# Patient Record
Sex: Male | Born: 1965
Health system: Southern US, Community
[De-identification: ages and names within clinical notes are randomized; demographics above are authoritative.]

## PROBLEM LIST (undated history)

## (undated) ENCOUNTER — Emergency Department (HOSPITAL_COMMUNITY): Admission: EM | Payer: Self-pay | Source: Home / Self Care

## (undated) DIAGNOSIS — E039 Hypothyroidism, unspecified: Secondary | ICD-10-CM

## (undated) DIAGNOSIS — F419 Anxiety disorder, unspecified: Secondary | ICD-10-CM

## (undated) DIAGNOSIS — K589 Irritable bowel syndrome without diarrhea: Secondary | ICD-10-CM

## (undated) HISTORY — PX: COLOSTOMY: SHX63

## (undated) HISTORY — DX: Irritable bowel syndrome, unspecified: K58.9

## (undated) HISTORY — DX: Hypothyroidism, unspecified: E03.9

---

## 1998-12-13 HISTORY — PX: VASECTOMY: SHX75

## 2003-02-12 ENCOUNTER — Encounter: Payer: Self-pay | Admitting: Family Medicine

## 2003-02-12 ENCOUNTER — Ambulatory Visit (HOSPITAL_COMMUNITY): Admission: RE | Admit: 2003-02-12 | Discharge: 2003-02-12 | Payer: Self-pay | Admitting: Family Medicine

## 2005-01-05 ENCOUNTER — Ambulatory Visit (HOSPITAL_COMMUNITY): Admission: RE | Admit: 2005-01-05 | Discharge: 2005-01-05 | Payer: Self-pay | Admitting: Family Medicine

## 2005-02-15 ENCOUNTER — Ambulatory Visit: Payer: Self-pay | Admitting: Internal Medicine

## 2005-02-19 ENCOUNTER — Ambulatory Visit (HOSPITAL_COMMUNITY): Admission: RE | Admit: 2005-02-19 | Discharge: 2005-02-19 | Payer: Self-pay | Admitting: Internal Medicine

## 2005-02-22 ENCOUNTER — Ambulatory Visit (HOSPITAL_COMMUNITY): Admission: RE | Admit: 2005-02-22 | Discharge: 2005-02-22 | Payer: Self-pay | Admitting: Internal Medicine

## 2005-03-15 ENCOUNTER — Ambulatory Visit: Payer: Self-pay | Admitting: Internal Medicine

## 2005-03-18 ENCOUNTER — Emergency Department (HOSPITAL_COMMUNITY): Admission: EM | Admit: 2005-03-18 | Discharge: 2005-03-19 | Payer: Self-pay | Admitting: *Deleted

## 2005-12-13 HISTORY — PX: CHOLECYSTECTOMY: SHX55

## 2006-01-07 ENCOUNTER — Encounter (HOSPITAL_COMMUNITY): Admission: RE | Admit: 2006-01-07 | Discharge: 2006-02-06 | Payer: Self-pay | Admitting: Family Medicine

## 2006-02-15 ENCOUNTER — Ambulatory Visit (HOSPITAL_COMMUNITY): Admission: RE | Admit: 2006-02-15 | Discharge: 2006-02-16 | Payer: Self-pay | Admitting: *Deleted

## 2006-02-15 ENCOUNTER — Encounter (INDEPENDENT_AMBULATORY_CARE_PROVIDER_SITE_OTHER): Payer: Self-pay | Admitting: Specialist

## 2006-02-16 ENCOUNTER — Observation Stay (HOSPITAL_COMMUNITY): Admission: EM | Admit: 2006-02-16 | Discharge: 2006-02-17 | Payer: Self-pay | Admitting: Emergency Medicine

## 2009-12-15 ENCOUNTER — Ambulatory Visit: Payer: Self-pay | Admitting: Orthopedic Surgery

## 2009-12-15 DIAGNOSIS — L03019 Cellulitis of unspecified finger: Secondary | ICD-10-CM

## 2009-12-15 DIAGNOSIS — M76829 Posterior tibial tendinitis, unspecified leg: Secondary | ICD-10-CM | POA: Insufficient documentation

## 2011-01-03 ENCOUNTER — Encounter (INDEPENDENT_AMBULATORY_CARE_PROVIDER_SITE_OTHER): Payer: Self-pay | Admitting: Internal Medicine

## 2011-01-12 NOTE — Letter (Signed)
Summary: History form  History form   Imported By: Jacklynn Ganong 12/16/2009 15:33:01  _____________________________________________________________________  External Attachment:    Type:   Image     Comment:   External Document

## 2011-01-12 NOTE — Assessment & Plan Note (Signed)
Summary: NAIL REMOVAL/PER DR HARRISON/CAF   Vital Signs:  Patient profile:   45 year old male Weight:      173 pounds Pulse rate:   76 / minute Resp:     18 per minute  Vitals Entered By: Fuller Canada MD (December 15, 2009 4:37 PM)  Visit Type:  Initial visit  Referring Provider:  self referral   CC:  I have a splinter in my fingernail .  History of Present Illness: I saw John Wu in the office today for an initial visit.  He is a 45 years old man with the complaint of:  chief complaint:  splinter under finger nail.  pain -duration 12/05/09.  -location left index finger splinter under finger nail.  -severity [1-10] sore, no pain. -timing constant  -worsened ZO:XWRUEAV it  -improved by: OTC meds  -quality: aching  -context: He was getting the Christmas gifts out of the attic and a splinter entered his finger. He pulled some of it out. Over the last 2 days he noticed increased redness, swelling and discomfort.  -other symptoms no numbness. -xrays done & where:  none needed.   Allergies (verified): No Known Drug Allergies  Past History:  Past Medical History: thyroid  Past Surgical History: gallbladder vasectomy  Family History: Family History Coronary Heart Disease male < 92  Social History: Patient is married.  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  Review of Systems MS:  Complains of joint pain; denies rheumatoid arthritis, joint swelling, gout, bone cancer, osteoporosis, and .  The review of systems is negative for General, Cardiac , Resp, GI, GU, Neuro, Endo, Psych, Derm, EENT, Immunology, and Lymphatic.  Physical Exam  General:  Well developed, well nourished, normal body habitus; no deformities, normal grooming. Head:  normocephalic and atraumatic Neck:  no masses, thyromegaly, or abnormal cervical nodes Msk:   normal posture and gait Pulses:  pulses in the left hand and right foot are normal  Extremities:  left  index finger  there is redness and erythema of the nail fold with swelling and tenderness; there appears to be a splinter under the nail.  ROM of the IP joints is normal   motor function is normal   all of the IP joints are stable    right ankle: swelling and tenderness are noted over the medial ankle beneath the medial malleolus. the posterior tibial tendon is tender up to the lower 1/3 of the tibia.  there is pain with passive eversion of the foot   the arch is low but not on the floor. the pes palnus is flexible.   5-/5 weakness in the PTT.   the ankle is stable  Neurologic:  no focal deficits, normal coordination, sensation hand and foot  Skin:  see previous sections, normal except the left IF   no lymphangitis  Psych:  alert and cooperative; normal mood and affect; normal attention span and concentration   Impression & Recommendations:  Problem # 1:  TENDINITIS, TIBIALIS (ICD-726.72) Assessment New  3 VIEWS ANKLE   HEEL SPUR AND ACHILLES SPUR, MILD ANATOMIC CHANGES TO THE TALUS [NOT OA]  IMPERESSION: NORMAL ANKLE WITH INCIDENTAL SPURS   NSAID, ORTHOTIC, STRETCHING, OBTAIN NEW SHOES FOR TRAINING [CURRENT ONES ARE 6 MOS OLD], ICE X 6 WEEKS   Orders: Ankle x-ray complete,  minimum 3 views (40981) New Patient Level IV (19147)  Problem # 2:  PARONYCHIA FINGER (ICD-681.02) Assessment: New  PROCEDURE   NAIL REMOVAL LIF:  ANESTHESIA LOCAL 1% LIDOCAINE PLAIN 20 CC  DX: PARONYCHIA FB REMOVAL   AFTER ADEQUATE ANESTHESIA, THE NAILPLATE WAS SEPARATED FROM THE NAIL BED WITH BLUNT DISSECTION. IT WAS REMOVED AND THE FB WAS ENCOUNTERED. IT WAS REMOVED. THE PARONYCHIA WAS THEN DECOMPRESSED.  STERILE DRESS AND FINR DRESSING WERE APPLIED   800MG  IBUPROFEN WAS GIVEN BY MOUTH   Orders: New Patient Level IV (96295)  Medications Added to Medication List This Visit: 1)  Keflex 500 Mg Caps (Cephalexin) .Marland Kitchen.. 1 by mouth two times a day 2)  Vicodin 5-500 Mg Tabs  (Hydrocodone-acetaminophen) .Marland Kitchen.. 1 by mouth q 4 as needed pain  Patient Instructions: 1)  i will see him at his home on weds  Prescriptions: VICODIN 5-500 MG TABS (HYDROCODONE-ACETAMINOPHEN) 1 by mouth q 4 as needed pain  #30 x 1   Entered and Authorized by:   Fuller Canada MD   Signed by:   Fuller Canada MD on 12/15/2009   Method used:   Print then Give to Patient   RxID:   [id number] KEFLEX 500 MG CAPS (CEPHALEXIN) 1 by mouth two times a day  #20 x 0   Entered and Authorized by:   Fuller Canada MD   Signed by:   Fuller Canada MD on 12/15/2009   Method used:   Print then Give to Patient   RxID:   [id number]

## 2011-04-30 NOTE — Op Note (Signed)
NAMEBLAS, RICHES              ACCOUNT NO.:  [id number]   MEDICAL RECORD NO.:  [id number]          PATIENT TYPE:  AMB   LOCATION:  DAY                          FACILITY:  [facility]   PHYSICIAN:  Alfonse Ras, MD   DATE OF BIRTH:  03/12/1966   DATE OF PROCEDURE:  02/15/2006  DATE OF DISCHARGE:                                 OPERATIVE REPORT   PREOPERATIVE DIAGNOSIS:  Symptomatic biliary dyskinesia.   POSTOPERATIVE DIAGNOSIS:  Symptomatic biliary dyskinesia.   PROCEDURE:  Laparoscopic cholecystectomy with intraoperative cholangiogram.   SURGEON:  Alfonse Ras, MD   ASSISTANT:  Lorne Skeens. Hoxworth, M.D.   FINDINGS:  Normal cholangiogram.   DESCRIPTION:  The patient was taken to the operating room, and placed in the  supine position after adequate general anesthesia was induced using  endotracheal tube, the abdomen was prepped and draped in a normal sterile  fashion. Using a transverse infraumbilical incision, I dissected down to the  fascia. The fascia was opened vertically. An #0 Vicryl pursestring suture  was placed on the fascial defect. A Hasson trocar was placed in the abdomen  and pneumoperitoneum was obtained.   Under direct vision an additional 10-mm trocar was placed in the subxiphoid  region and two 5-mm ports were placed in the right abdomen. Gallbladder was  identified and retracted cephalad. Dissection at the infundibulum of the  gallbladder and getting a critical view of the cystic duct, it was dissected  out and clipped proximally. It was a very small duct, but a Reddick catheter  was introduced into it after a ductotomy was made. Cholangiogram was  performed which showed free flow into the duodenum, normal common bile duct  and normal hepatic ducts. There were no filling defects. The catheter was  removed and then the duct was triply clipped and divided. Cystic artery was  dissected in a similar fashion, triply clipped and divided.   Gallbladder was  taken off the gallbladder bed using Bovie electrocautery  placed in EndoCatch bag, and removed through the umbilical port. Adequate  hemostasis was assured. Pneumoperitoneum was released. Incisions were  injected with Marcaine. The infraumbilical fascial defect was closed with an  #0 Vicryl pursestring suture. Skin incisions were closed with subcuticular 4-  0 Monocryls. Steri-Strips and sterile dressings were applied. The patient  tolerated the procedure well, and went to PACU in good condition.      Alfonse Ras, MD  Electronically Signed    KRE/MEDQ  D:  02/15/2006  T:  02/16/2006  Job:  [id number]

## 2012-01-06 ENCOUNTER — Other Ambulatory Visit: Payer: Self-pay | Admitting: Family Medicine

## 2012-01-11 ENCOUNTER — Ambulatory Visit (HOSPITAL_COMMUNITY)
Admission: RE | Admit: 2012-01-11 | Discharge: 2012-01-11 | Disposition: A | Discharge status: Home / Self Care | Payer: 59 | Source: Ambulatory Visit | Attending: Family Medicine | Admitting: Family Medicine

## 2012-01-11 DIAGNOSIS — Q619 Cystic kidney disease, unspecified: Secondary | ICD-10-CM | POA: Insufficient documentation

## 2012-01-11 DIAGNOSIS — R109 Unspecified abdominal pain: Secondary | ICD-10-CM | POA: Insufficient documentation

## 2012-05-19 ENCOUNTER — Other Ambulatory Visit: Payer: Self-pay | Admitting: Dermatology

## 2012-06-22 ENCOUNTER — Other Ambulatory Visit: Payer: Self-pay | Admitting: Dermatology

## 2012-10-20 ENCOUNTER — Other Ambulatory Visit: Payer: Self-pay | Admitting: Family Medicine

## 2012-10-20 ENCOUNTER — Ambulatory Visit (HOSPITAL_COMMUNITY)
Admission: RE | Admit: 2012-10-20 | Discharge: 2012-10-20 | Disposition: A | Discharge status: Home / Self Care | Payer: 59 | Source: Ambulatory Visit | Attending: Family Medicine | Admitting: Family Medicine

## 2012-10-20 DIAGNOSIS — R0989 Other specified symptoms and signs involving the circulatory and respiratory systems: Secondary | ICD-10-CM | POA: Insufficient documentation

## 2012-10-20 DIAGNOSIS — R0609 Other forms of dyspnea: Secondary | ICD-10-CM | POA: Insufficient documentation

## 2012-10-20 DIAGNOSIS — R0600 Dyspnea, unspecified: Secondary | ICD-10-CM

## 2012-10-20 DIAGNOSIS — J3489 Other specified disorders of nose and nasal sinuses: Secondary | ICD-10-CM | POA: Insufficient documentation

## 2013-05-23 ENCOUNTER — Encounter: Payer: Self-pay | Admitting: Nurse Practitioner

## 2013-05-23 ENCOUNTER — Ambulatory Visit (INDEPENDENT_AMBULATORY_CARE_PROVIDER_SITE_OTHER): Payer: 59 | Admitting: Nurse Practitioner

## 2013-05-23 ENCOUNTER — Ambulatory Visit: Payer: Self-pay | Admitting: Nurse Practitioner

## 2013-05-23 VITALS — Temp 98.3°F | Wt 180.8 lb

## 2013-05-23 DIAGNOSIS — E039 Hypothyroidism, unspecified: Secondary | ICD-10-CM | POA: Insufficient documentation

## 2013-05-23 DIAGNOSIS — L255 Unspecified contact dermatitis due to plants, except food: Secondary | ICD-10-CM

## 2013-05-23 MED ORDER — METHYLPREDNISOLONE ACETATE 40 MG/ML IJ SUSP
40.0000 mg | Freq: Once | INTRAMUSCULAR | Status: AC
  Administered 2013-05-23: 40 mg via INTRAMUSCULAR

## 2013-05-23 MED ORDER — CLOBETASOL PROPIONATE 0.05 % EX CREA: TOPICAL_CREAM | Freq: Two times a day (BID) | CUTANEOUS | Status: DC

## 2013-05-23 MED ORDER — PREDNISONE 20 MG PO TABS: ORAL_TABLET | ORAL | Status: DC

## 2013-05-23 NOTE — Assessment & Plan Note (Signed)
TSH ordered.

## 2013-05-23 NOTE — Patient Instructions (Signed)
Claritin 10 mg in the morning Benadryl 25 mg at night  

## 2013-05-23 NOTE — Progress Notes (Signed)
Subjective:  Presents complaints of a poison oak rash that began 10 days ago. Began after working outside. Has had a similar rash in the past. Very pruritic. No fever. Mainly on the legs arms and low back area. No wheezing. No fever. Also patient requesting refills on his levothyroxine.  Objective:   Temp(Src) 98.3 F (36.8 C)  Wt 180 lb 12.8 oz (82.01 kg) NAD. Alert, oriented. Lungs clear. Heart regular rate rhythm. Multiple patches of erythema with small clear fluid filled lesions noted particularly on the legs with a few scattered patches on the arms and low back area.  Assessment:Rhus dermatitis - Plan: methylPREDNISolone acetate (DEPO-MEDROL) injection 40 mg  Hypothyroidism - Plan: TSH  Plan: Hold on refills on levothyroxine until TSH results are available. Tomorrow start prednisone 20 mg nine-day taper. Clobetasol cream twice a day when necessary. Antihistamines as directed. Call back if worsens or persists.

## 2013-06-11 ENCOUNTER — Other Ambulatory Visit: Payer: Self-pay | Admitting: Nurse Practitioner

## 2013-06-11 ENCOUNTER — Other Ambulatory Visit: Payer: Self-pay | Admitting: Family Medicine

## 2013-06-11 MED ORDER — LEVOTHYROXINE SODIUM 88 MCG PO TABS: 88.0000 ug | ORAL_TABLET | Freq: Every day | ORAL | Status: DC

## 2013-06-12 ENCOUNTER — Encounter: Payer: Self-pay | Admitting: *Deleted

## 2013-07-09 ENCOUNTER — Other Ambulatory Visit: Payer: Self-pay | Admitting: *Deleted

## 2013-07-09 MED ORDER — LEVOTHYROXINE SODIUM 88 MCG PO TABS: 88.0000 ug | ORAL_TABLET | Freq: Every day | ORAL | Status: DC

## 2013-10-01 ENCOUNTER — Other Ambulatory Visit: Payer: Self-pay | Admitting: *Deleted

## 2013-10-01 MED ORDER — LEVOTHYROXINE SODIUM 88 MCG PO TABS: 88.0000 ug | ORAL_TABLET | Freq: Every day | ORAL | Status: DC

## 2013-10-31 ENCOUNTER — Encounter: Payer: Self-pay | Admitting: Nurse Practitioner

## 2013-10-31 ENCOUNTER — Ambulatory Visit (INDEPENDENT_AMBULATORY_CARE_PROVIDER_SITE_OTHER): Payer: 59 | Admitting: Nurse Practitioner

## 2013-10-31 VITALS — BP 120/72 | Temp 98.6°F | Ht 70.0 in | Wt 184.0 lb

## 2013-10-31 DIAGNOSIS — M62838 Other muscle spasm: Secondary | ICD-10-CM

## 2013-10-31 DIAGNOSIS — J300 Vasomotor rhinitis: Secondary | ICD-10-CM

## 2013-10-31 DIAGNOSIS — G44209 Tension-type headache, unspecified, not intractable: Secondary | ICD-10-CM

## 2013-10-31 DIAGNOSIS — J309 Allergic rhinitis, unspecified: Secondary | ICD-10-CM

## 2013-10-31 MED ORDER — CHLORZOXAZONE 500 MG PO TABS: 500.0000 mg | ORAL_TABLET | Freq: Three times a day (TID) | ORAL | Status: DC | PRN

## 2013-10-31 MED ORDER — FLUTICASONE PROPIONATE 50 MCG/ACT NA SUSP: 2.0000 | Freq: Every day | NASAL | Status: DC

## 2013-10-31 NOTE — Patient Instructions (Addendum)
TENS unit (Smart relief) Ice or heat Massage: [employer]

## 2013-11-01 ENCOUNTER — Encounter: Payer: Self-pay | Admitting: Nurse Practitioner

## 2013-11-01 NOTE — Progress Notes (Signed)
Subjective:  Presents complaints of off-and-on headache for the past week. Mainly occurs in the temporal area and occasionally in the occipital area. No fever cough runny nose sore throat or ear pain. No nausea vomiting. Minimal dizziness. Normal activity. Describes as a nagging headache. No severe pain. No visual changes. No numbness or weakness of the face arms or legs. No difficulty speaking or swallowing. Slight sinus pressure at times. Has used saline rinse and decongestants. Notices headache more when he is stressed. Scalp is slightly tender to palpation at that time.  Objective:   BP 120/72  Temp(Src) 98.6 F (37 C) (Oral)  Ht 5\' 10"  (1.778 m)  Wt 184 lb (83.462 kg)  BMI 26.40 kg/m2 NAD. Alert, oriented. Mildly anxious affect. TMs retracted bilateral, no erythema. Nasal mucosa pale and slightly boggy. Pharynx mildly injected with clear PND noted. Neck supple with mild soft nontender adenopathy. Lungs clear. Heart regular rate rhythm. Minimal tenderness with palpation of the temporal area. Slightly tender very tight muscles noted in the lateral neck area. Also some slight tenderness along the trapezius and into the rhomboids. EOMs intact without nystagmus. Point-to-point localization normal limit. Hand strength 5+ bilateral. Reflexes normal limit. Romberg negative.  Assessment:Vasomotor rhinitis  Muscle contraction headache  Muscle spasms of neck  Plan: Meds ordered this encounter  Medications  . chlorzoxazone (PARAFON) 500 MG tablet    Sig: Take 1 tablet (500 mg total) by mouth 3 (three) times daily as needed for muscle spasms.    Dispense:  30 tablet    Refill:  0    Order Specific Question:  Supervising Provider    Answer:  Merlyn Albert [[id number]]  . fluticasone (FLONASE) 50 MCG/ACT nasal spray    Sig: Place 2 sprays into both nostrils daily. Prn head congestion    Dispense:  16 g    Refill:  5    Order Specific Question:  Supervising Provider    Answer:  Merlyn Albert  [[id number]]  TENS unit (Smart relief) Ice or heat Massage therapy. Discussed importance of stress reduction. Continue OTC antihistamines. Call back if symptoms worsen or persist. Warning signs reviewed.

## 2014-03-07 ENCOUNTER — Other Ambulatory Visit: Payer: Self-pay | Admitting: Family Medicine

## 2014-04-23 ENCOUNTER — Encounter: Payer: Self-pay | Admitting: Orthopedic Surgery

## 2014-04-23 ENCOUNTER — Ambulatory Visit (INDEPENDENT_AMBULATORY_CARE_PROVIDER_SITE_OTHER): Payer: 59

## 2014-04-23 ENCOUNTER — Ambulatory Visit (INDEPENDENT_AMBULATORY_CARE_PROVIDER_SITE_OTHER): Payer: 59 | Admitting: Orthopedic Surgery

## 2014-04-23 VITALS — BP 118/71 | Ht 70.0 in | Wt 180.0 lb

## 2014-04-23 DIAGNOSIS — S5010XA Contusion of unspecified forearm, initial encounter: Secondary | ICD-10-CM

## 2014-04-23 DIAGNOSIS — S5011XA Contusion of right forearm, initial encounter: Secondary | ICD-10-CM | POA: Insufficient documentation

## 2014-04-23 DIAGNOSIS — M25529 Pain in unspecified elbow: Secondary | ICD-10-CM

## 2014-04-23 NOTE — Progress Notes (Signed)
Patient ID: John Wu, male   DOB: 1966/07/17, 48 y.o.   MRN: [id number]  Chief Complaint  Patient presents with  . Arm Pain    Right arm pain due to injury 04/22/14   48 year old male healthy except for some thyroid disease and IBS who was hit with a baseball thrown baseball yesterday complains of dull aching pain over his right forearm without numbness or tingling rates pain 4/10  History of cholecystectomy. History of vasectomy. Takes thyroid medicine daily.  No allergies  Exam normal grooming hygiene orientation and mood. Gait and station are normal. He has tenderness over the mid forearm of the right arm wrist elbow motion normal elbow wrist stable skin intact normal sensation normal pulse normal grip  X-ray negative  Impression  Encounter Diagnoses  Name Primary?  . Pain in joint, upper arm   . Contusion of forearm, right Yes

## 2014-04-23 NOTE — Patient Instructions (Signed)
Ice  Ibuprofen as needed

## 2014-06-05 ENCOUNTER — Other Ambulatory Visit: Payer: Self-pay | Admitting: Family Medicine

## 2014-07-12 ENCOUNTER — Telehealth: Payer: Self-pay | Admitting: Family Medicine

## 2014-07-12 DIAGNOSIS — E039 Hypothyroidism, unspecified: Secondary | ICD-10-CM

## 2014-07-12 NOTE — Telephone Encounter (Signed)
tsh plus ov

## 2014-07-12 NOTE — Telephone Encounter (Signed)
Patients needs order for blood work.

## 2014-07-12 NOTE — Telephone Encounter (Signed)
Blood work -TSH 05/2013

## 2014-07-15 NOTE — Telephone Encounter (Signed)
Blood work order placed in [employer]. Patient notified.

## 2014-07-16 LAB — TSH: TSH: 4.649 u[IU]/mL — ABNORMAL HIGH (ref 0.350–4.500)

## 2014-07-17 ENCOUNTER — Encounter: Payer: Self-pay | Admitting: Family Medicine

## 2014-07-17 ENCOUNTER — Ambulatory Visit (INDEPENDENT_AMBULATORY_CARE_PROVIDER_SITE_OTHER): Payer: 59 | Admitting: Family Medicine

## 2014-07-17 VITALS — BP 114/70 | Ht 70.0 in | Wt 183.0 lb

## 2014-07-17 DIAGNOSIS — Z79899 Other long term (current) drug therapy: Secondary | ICD-10-CM

## 2014-07-17 DIAGNOSIS — Z125 Encounter for screening for malignant neoplasm of prostate: Secondary | ICD-10-CM

## 2014-07-17 DIAGNOSIS — Z0189 Encounter for other specified special examinations: Secondary | ICD-10-CM

## 2014-07-17 DIAGNOSIS — E039 Hypothyroidism, unspecified: Secondary | ICD-10-CM

## 2014-07-17 MED ORDER — LEVOTHYROXINE SODIUM 100 MCG PO TABS: 100.0000 ug | ORAL_TABLET | Freq: Every day | ORAL | Status: DC

## 2014-07-17 NOTE — Progress Notes (Signed)
   Subjective:    Patient ID: John Wu, male    DOB: 27-Sep-1966, 48 y.o.   MRN: [id number]  HPIHypothyroidism check up. Pt states no concerns. Had TSH done on 07/15/14. Pt takes med every day. No problems.   Exercise two mi per d and reg situps, exercising quite regularly  Overall eating welll,  Watching diet  Results for orders placed in visit on 07/12/14  TSH      Result Value Ref Range   TSH 4.649 (*) 0.350 - 4.500 uIU/mL    Some fatigue at times but overall good Review of Systems No headache no chest pain no back pain no abdominal pain no change in bowel habits ROS otherwise negative    Objective:   Physical Exam  Alert no apparent distress. Lungs clear. Heart regular in rhythm. HEENT normal.      Assessment & Plan:  Impression hypothyroidism somewhat suboptimal control. Discussed. Plan increase thyroid to 100 mcg daily. Repeat blood work in 2 or 3 months. Wellness exam in 3 months. WSL

## 2014-11-01 LAB — HEPATIC FUNCTION PANEL
ALK PHOS: 63 U/L (ref 39–117)
ALT: 20 U/L (ref 0–53)
AST: 15 U/L (ref 0–37)
Albumin: 4 g/dL (ref 3.5–5.2)
BILIRUBIN DIRECT: 0.2 mg/dL (ref 0.0–0.3)
BILIRUBIN INDIRECT: 0.5 mg/dL (ref 0.2–1.2)
BILIRUBIN TOTAL: 0.7 mg/dL (ref 0.2–1.2)
Total Protein: 5.8 g/dL — ABNORMAL LOW (ref 6.0–8.3)

## 2014-11-01 LAB — BASIC METABOLIC PANEL
BUN: 19 mg/dL (ref 6–23)
CALCIUM: 9.4 mg/dL (ref 8.4–10.5)
CO2: 30 meq/L (ref 19–32)
CREATININE: 1.07 mg/dL (ref 0.50–1.35)
Chloride: 106 mEq/L (ref 96–112)
GLUCOSE: 97 mg/dL (ref 70–99)
Potassium: 4.7 mEq/L (ref 3.5–5.3)
Sodium: 143 mEq/L (ref 135–145)

## 2014-11-01 LAB — LIPID PANEL
Cholesterol: 146 mg/dL (ref 0–200)
HDL: 37 mg/dL — ABNORMAL LOW (ref >39)
LDL Cholesterol: 88 mg/dL (ref 0–99)
Total CHOL/HDL Ratio: 3.9 Ratio
Triglycerides: 103 mg/dL (ref <150)
VLDL: 21 mg/dL (ref 0–40)

## 2014-11-01 LAB — TSH: TSH: 2.353 u[IU]/mL (ref 0.350–4.500)

## 2014-11-02 LAB — PSA: PSA: 0.85 ng/mL (ref <=4.00)

## 2014-12-10 ENCOUNTER — Ambulatory Visit (INDEPENDENT_AMBULATORY_CARE_PROVIDER_SITE_OTHER): Payer: 59 | Admitting: Family Medicine

## 2014-12-10 ENCOUNTER — Encounter: Payer: Self-pay | Admitting: Family Medicine

## 2014-12-10 VITALS — BP 120/84 | Ht 70.0 in | Wt 184.0 lb

## 2014-12-10 DIAGNOSIS — Z0000 Encounter for general adult medical examination without abnormal findings: Secondary | ICD-10-CM

## 2014-12-10 MED ORDER — LEVOTHYROXINE SODIUM 100 MCG PO TABS: 100.0000 ug | ORAL_TABLET | Freq: Every day | ORAL | Status: DC

## 2014-12-10 NOTE — Progress Notes (Signed)
Subjective:    Patient ID: John Wu, male    DOB: 1966-05-07, 48 y.o.   MRN: [id number]  HPI The patient comes in today for a wellness visit.  A review of their health history was completed.  A review of medications was also completed.  Any needed refills: May need to change the dose of his levothyroxine   Eating habits: Health conscious  Falls/  MVA accidents in past few months: No  Regular exercise: Treadmill daily, runs 2 miles, and sit ups daily (300)  Specialist pt sees on regular basis: No  Preventative health issues were discussed.   Additional concerns: Refuses flu vaccine  Results for orders placed or performed in visit on 07/17/14  TSH  Result Value Ref Range   TSH 2.353 0.350 - 4.500 uIU/mL  PSA  Result Value Ref Range   PSA 0.85 <=4.00 ng/mL  Lipid panel  Result Value Ref Range   Cholesterol 146 0 - 200 mg/dL   Triglycerides 103 <150 mg/dL   HDL 37 (L) >39 mg/dL   Total CHOL/HDL Ratio 3.9 Ratio   VLDL 21 0 - 40 mg/dL   LDL Cholesterol 88 0 - 99 mg/dL  Hepatic function panel  Result Value Ref Range   Total Bilirubin 0.7 0.2 - 1.2 mg/dL   Bilirubin, Direct 0.2 0.0 - 0.3 mg/dL   Indirect Bilirubin 0.5 0.2 - 1.2 mg/dL   Alkaline Phosphatase 63 39 - 117 U/L   AST 15 0 - 37 U/L   ALT 20 0 - 53 U/L   Total Protein 5.8 (L) 6.0 - 8.3 g/dL   Albumin 4.0 3.5 - 5.2 g/dL  Basic metabolic panel  Result Value Ref Range   Sodium 143 135 - 145 mEq/L   Potassium 4.7 3.5 - 5.3 mEq/L   Chloride 106 96 - 112 mEq/L   CO2 30 19 - 32 mEq/L   Glucose, Bld 97 70 - 99 mg/dL   BUN 19 6 - 23 mg/dL   Creat 1.07 0.50 - 1.35 mg/dL   Calcium 9.4 8.4 - 10.5 mg/dL   Good diet overall, but not perfect, eats a lot of salad    Review of Systems  Constitutional: Negative for fever, activity change and appetite change.  HENT: Negative for congestion and rhinorrhea.   Eyes: Negative for discharge.  Respiratory: Negative for cough and wheezing.   Cardiovascular:  Negative for chest pain.  Gastrointestinal: Negative for vomiting, abdominal pain and blood in stool.  Genitourinary: Negative for frequency and difficulty urinating.  Musculoskeletal: Negative for neck pain.  Skin: Negative for rash.  Allergic/Immunologic: Negative for environmental allergies and food allergies.  Neurological: Negative for weakness and headaches.  Psychiatric/Behavioral: Negative for agitation.  All other systems reviewed and are negative.      Objective:   Physical Exam  Constitutional: He appears well-developed and well-nourished.  HENT:  Head: Normocephalic and atraumatic.  Right Ear: External ear normal.  Left Ear: External ear normal.  Nose: Nose normal.  Mouth/Throat: Oropharynx is clear and moist.  Eyes: EOM are normal. Pupils are equal, round, and reactive to light.  Neck: Normal range of motion. Neck supple. No thyromegaly present.  Cardiovascular: Normal rate, regular rhythm and normal heart sounds.   No murmur heard. Pulmonary/Chest: Effort normal and breath sounds normal. No respiratory distress. He has no wheezes.  Abdominal: Soft. Bowel sounds are normal. He exhibits no distension and no mass. There is no tenderness.  Genitourinary: Prostate normal and penis normal.  Musculoskeletal: Normal range of motion. He exhibits no edema.  Lymphadenopathy:    He has no cervical adenopathy.  Neurological: He is alert. He exhibits normal muscle tone.  Skin: Skin is warm and dry. No erythema.  Psychiatric: He has a normal mood and affect. His behavior is normal. Judgment normal.          Assessment & Plan:  Impression wellness exam #2 hypothyroidism good control discussed #3 colonoscopy discussed plan patient to call and set up colonoscopy. Maintain same thyroid. Diet exercise discussed. WSL

## 2015-06-13 ENCOUNTER — Other Ambulatory Visit: Payer: Self-pay | Admitting: Nurse Practitioner

## 2015-06-13 MED ORDER — PREDNISONE 20 MG PO TABS: ORAL_TABLET | ORAL | Status: DC

## 2015-06-13 MED ORDER — MOMETASONE FUROATE 0.1 % EX CREA: 1.0000 "application " | TOPICAL_CREAM | Freq: Every day | CUTANEOUS | Status: DC

## 2015-06-13 NOTE — Progress Notes (Signed)
Fax from his wife: leaving to go out of town today at 4 pm. Has poison oak rash since Monday not responding to topical cortisone and antihistamine. Will send in prednisone taper and elocon cream. Office visit if no better.

## 2015-12-14 ENCOUNTER — Other Ambulatory Visit: Payer: Self-pay | Admitting: Family Medicine

## 2016-03-02 ENCOUNTER — Ambulatory Visit (INDEPENDENT_AMBULATORY_CARE_PROVIDER_SITE_OTHER): Payer: Self-pay | Admitting: Orthopedic Surgery

## 2016-03-02 VITALS — BP 135/70 | HR 70 | Ht 70.0 in | Wt 184.8 lb

## 2016-03-02 DIAGNOSIS — M722 Plantar fascial fibromatosis: Secondary | ICD-10-CM

## 2016-03-02 NOTE — Progress Notes (Signed)
Chief Complaint  Patient presents with  . Foot Pain    Right foot pain   HPI 50 yo with three-month history of right plantar heel pain. Patient has classic restart pain pain after sitting pain when getting up. 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 He has pain getting out of bed in the morning as well. It is best described as sharp burning pain on the plantar aspect of the heel and has been unresponsive to over-the-counter arch support, ice, anti-inflammatory.  Review of Systems  All other systems reviewed and are negative.   Past Medical History  Diagnosis Date  . IBS (irritable bowel syndrome)   . Hypothyroidism     Past Surgical History  Procedure Laterality Date  . Cholecystectomy  2007  . Vasectomy  2000   Family History  Problem Relation Age of Onset  . Hypertension Father   . Heart attack Father    Social History  Substance Use Topics  . Smoking status: Never Smoker   . Smokeless tobacco: Not on file  . Alcohol Use: Not on file    Current outpatient prescriptions:  .  levothyroxine (SYNTHROID, LEVOTHROID) 100 MCG tablet, TAKE 1 TABLET DAILY BEFORE BREAKFAST, Disp: 90 tablet, Rfl: 1 .  mometasone (ELOCON) 0.1 % cream, Apply 1 application topically daily. (Patient not taking: Reported on 03/02/2016), Disp: 45 g, Rfl: 0 .  predniSONE (DELTASONE) 20 MG tablet, 3 po qd x 3 d then 2 po qd x 3 d then 1 po qd x 3 d (Patient not taking: Reported on 03/02/2016), Disp: 18 tablet, Rfl: 0  BP 135/70 mmHg  Pulse 70  Ht 5\' 10"  (1.778 m)  Wt 184 lb 12.8 oz (83.825 kg)  BMI 26.52 kg/m2  Physical Exam  Constitutional: He is oriented to person, place, and time. He appears well-developed and well-nourished. No distress.  Cardiovascular: Normal rate and intact distal pulses.   Neurological: He is alert and oriented to person, place, and time.  Skin: Skin is warm and  dry. No rash noted. He is not diaphoretic. No erythema. No pallor.  Psychiatric: He has a normal mood and affect. His behavior is normal. Judgment and thought content normal.  Ambulatory status is normal Examination of right foot Normal plantigrade foot normal arch with tenderness on the plantar aspect of the heel mild tenderness over the nerve area as well Foot alignment otherwise normal. Ankle stable. Slightly tight heel cord with the knee extended relaxes with the knee flexed plantar flexion strength normal skin.  Ortho Exam   ASSESSMENT: Encounter Diagnosis  Name Primary?  . Plantar fasciitis Yes      PLAN  We will start with the standard treatment with Tuli heel cups, ice therapy, exercise program, cortisone injection.  Procedure note  Plantar fascial injection  Injection of the RIGHT    plantar fashion  Verbal consent was obtained. Timeout confirmed injection site as RIGHT   plantar fascia  25-gauge needle 3 mL syringe  40 mg of Depo-Medrol,  3 mL of 1% lidocaine  The skin was prepped with alcohol and ethyl chloride and the injection was given without complication sterile bandage was applied  The patient was in stable condition

## 2016-03-02 NOTE — Patient Instructions (Addendum)
Plantar Fasciitis With Rehab The plantar fascia is a fibrous, ligament-like, soft-tissue structure that spans the bottom of the foot. Plantar fasciitis, also called heel spur syndrome, is a condition that causes pain in the foot due to inflammation of the tissue. SYMPTOMS   Pain and tenderness on the underneath side of the foot.  Pain that worsens with standing or walking. CAUSES  Plantar fasciitis is caused by irritation and injury to the plantar fascia on the underneath side of the foot. Common mechanisms of injury include:  Direct trauma to bottom of the foot.  Damage to a small nerve that runs under the foot where the main fascia attaches to the heel bone.  Stress placed on the plantar fascia due to bone spurs. RISK INCREASES WITH:   Activities that place stress on the plantar fascia (running, jumping, pivoting, or cutting).  Poor strength and flexibility.  Improperly fitted shoes.  Tight calf muscles.  Flat feet.  Failure to warm-up properly before activity.  Obesity. PREVENTION  Warm up and stretch properly before activity.  Allow for adequate recovery between workouts.  Maintain physical fitness:  Strength, flexibility, and endurance.  Cardiovascular fitness.  Maintain a health body weight.  Avoid stress on the plantar fascia.  Wear properly fitted shoes, including arch supports for individuals who have flat feet. PROGNOSIS  If treated properly, then the symptoms of plantar fasciitis usually resolve without surgery. However, occasionally surgery is necessary. RELATED COMPLICATIONS   Recurrent symptoms that may result in a chronic condition.  Problems of the lower back that are caused by compensating for the injury, such as limping.  Pain or weakness of the foot during push-off following surgery.  Chronic inflammation, scarring, and partial or complete fascia tear, occurring more often from repeated injections. TREATMENT  Treatment initially involves  the use of ice and medication to help reduce pain and inflammation. The use of strengthening and stretching exercises may help reduce pain with activity, especially stretches of the Achilles tendon. These exercises may be performed at home or with a therapist. Your caregiver may recommend that you use heel cups of arch supports to help reduce stress on the plantar fascia. Occasionally, corticosteroid injections are given to reduce inflammation. If symptoms persist for greater than 6 months despite non-surgical (conservative), then surgery may be recommended.  MEDICATION   If pain medication is necessary, then nonsteroidal anti-inflammatory medications, such as aspirin and ibuprofen, or other minor pain relievers, such as acetaminophen, are often recommended.  Do not take pain medication within 7 days before surgery.  Prescription pain relievers may be given if deemed necessary by your caregiver. Use only as directed and only as much as you need.  Corticosteroid injections may be given by your caregiver. These injections should be reserved for the most serious cases, because they may only be given a certain number of times. HEAT AND COLD Cold treatment (icing) relieves pain and reduces inflammation. Use a frozen water bottle and roll the heel over the bottle for 20 min  SEEK IMMEDIATE MEDICAL CARE IF:  Treatment seems to offer no benefit, or the condition worsens.  Any medications produce adverse side effects. EXERCISES RANGE OF MOTION (ROM) AND STRETCHING EXERCISES - Plantar Fasciitis (Heel Spur Syndrome) These exercises may help you when beginning to rehabilitate your injury. Your symptoms may resolve with or without further involvement from your physician, physical therapist or athletic trainer. While completing these exercises, remember:   Restoring tissue flexibility helps normal motion to return to the joints. This  allows healthier, less painful movement and activity.  An effective  stretch should be held for at least 30 seconds.  A stretch should never be painful. You should only feel a gentle lengthening or release in the stretched tissue. RANGE OF MOTION - Toe Extension, Flexion  Sit with your right / left leg crossed over your opposite knee.  Grasp your toes and gently pull them back toward the top of your foot. You should feel a stretch on the bottom of your toes and/or foot.  Hold this stretch for ___2_______ seconds.  Now, gently pull your toes toward the bottom of your foot. You should feel a stretch on the top of your toes and or foot.  Hold this stretch for _____2_____ seconds. Repeat ____15______ times. Complete this stretch ____1______ times per day.  RANGE OF MOTION - Ankle Dorsiflexion, Active Assisted  Remove shoes and sit on a chair that is preferably not on a carpeted surface.  Place right / left foot under knee. Extend your opposite leg for support.  Keeping your heel down, slide your right / left foot back toward the chair until you feel a stretch at your ankle or calf. If you do not feel a stretch, slide your bottom forward to the edge of the chair, while still keeping your heel down.  Hold this stretch for __________ seconds. Repeat __________ times. Complete this stretch __________ times per day.  STRETCH - Gastroc, Standing  Place hands on wall.  Extend right / left leg, keeping the front knee somewhat bent.  Slightly point your toes inward on your back foot.  Keeping your right / left heel on the floor and your knee straight, shift your weight toward the wall, not allowing your back to arch.  You should feel a gentle stretch in the right / left calf. Hold this position for __________ seconds. Repeat __________ times. Complete this stretch __________ times per day. STRETCH - Soleus, Standing  Place hands on wall.  Extend right / left leg, keeping the other knee somewhat bent.  Slightly point your toes inward on your back  foot.  Keep your right / left heel on the floor, bend your back knee, and slightly shift your weight over the back leg so that you feel a gentle stretch deep in your back calf.  Hold this position for __________ seconds. Repeat __________ times. Complete this stretch __________ times per day. STRETCH - Gastrocsoleus, Standing  Note: This exercise can place a lot of stress on your foot and ankle. Please complete this exercise only if specifically instructed by your caregiver.   Place the ball of your right / left foot on a step, keeping your other foot firmly on the same step.  Hold on to the wall or a rail for balance.  Slowly lift your other foot, allowing your body weight to press your heel down over the edge of the step.  You should feel a stretch in your right / left calf.  Hold this position for __________ seconds.  Repeat this exercise with a slight bend in your right / left knee. Repeat __________ times. Complete this stretch __________ times per day.  STRENGTHENING EXERCISES - Plantar Fasciitis (Heel Spur Syndrome)  These exercises may help you when beginning to rehabilitate your injury. They may resolve your symptoms with or without further involvement from your physician, physical therapist or athletic trainer. While completing these exercises, remember:   Muscles can gain both the endurance and the strength needed for everyday activities  through controlled exercises.  Complete these exercises as instructed by your physician, physical therapist or athletic trainer. Progress the resistance and repetitions only as guided. STRENGTH - Towel Curls  Sit in a chair positioned on a non-carpeted surface.  Place your foot on a towel, keeping your heel on the floor.  Pull the towel toward your heel by only curling your toes. Keep your heel on the floor.  If instructed by your physician, physical therapist or athletic trainer, add ____________________ at the end of the towel. Repeat  __________ times. Complete this exercise __________ times per day. STRENGTH - Ankle Inversion  Secure one end of a rubber exercise band/tubing to a fixed object (table, pole). Loop the other end around your foot just before your toes.  Place your fists between your knees. This will focus your strengthening at your ankle.  Slowly, pull your big toe up and in, making sure the band/tubing is positioned to resist the entire motion.  Hold this position for __________ seconds.  Have your muscles resist the band/tubing as it slowly pulls your foot back to the starting position. Repeat __________ times. Complete this exercises __________ times per day.    This information is not intended to replace advice given to you by your health care provider. Make sure you discuss any questions you have with your health care provider.   Document Released: 11/29/2005 Document Revised: 04/15/2015 Document Reviewed: 03/13/2009 Elsevier Interactive Patient Education [employer].

## 2016-04-02 ENCOUNTER — Ambulatory Visit (INDEPENDENT_AMBULATORY_CARE_PROVIDER_SITE_OTHER): Payer: 59 | Admitting: Orthopedic Surgery

## 2016-04-02 ENCOUNTER — Encounter: Payer: Self-pay | Admitting: Orthopedic Surgery

## 2016-04-02 VITALS — BP 108/66 | Ht 70.0 in | Wt 184.0 lb

## 2016-04-02 DIAGNOSIS — M722 Plantar fascial fibromatosis: Secondary | ICD-10-CM | POA: Diagnosis not present

## 2016-04-02 NOTE — Progress Notes (Signed)
Patient ID: John Wu, male   DOB: [birth year], 50 y.o.   MRN: [id number]  Chief Complaint  Patient presents with  . Follow-up    right foot pain    HPI-followed for plantar fasciits; Treatment to date includes injection, heel cups, cryotherapy, stretching exercises and anti-inflammatories  ROS-the patient has no numbness or tingling in the foot. Complains of weightbearing pain when he gets out of bed in the morning  BP 108/66 mmHg  Ht 5\' 10"  (1.778 m)  Wt 184 lb (83.462 kg)  BMI 26.40 kg/m2  Physical Exam  Constitutional: He is oriented to person, place, and time. He appears well-developed and well-nourished. No distress.  Cardiovascular: Normal rate and intact distal pulses.   Pulses:      Dorsalis pedis pulses are 2+ on the right side.       Posterior tibial pulses are 2+ on the right side.  Musculoskeletal:       Right foot: There is tenderness. There is normal range of motion and no deformity.  There is tenderness over the packs to his nerve.  Neurological: He is alert and oriented to person, place, and time.  Skin: Skin is warm and dry. No rash noted. He is not diaphoretic. No erythema. No pallor.  Psychiatric: He has a normal mood and affect. His behavior is normal. Judgment and thought content normal.    Ortho Exam    ASSESSMENT AND PLAN   Plantar fasciitis with Baxter's nerve inflammatory response  Recommend injection. We did a medial approach injection this time including Baxter's nerve and plantar fascia injection  He will continue with a night splint his exercises cryotherapy and heel cups  Follow-up 4 weeks  Injection note  A steroid injection was performed  usmedial aspect of the heel including plantar fascia and Baxter's nerve using 1% lidocaine and 40 mg Depo-Medrol  Tolerated well  Patient gave verbal consent and timeout was completed to execute the injection. No comp occasions were noted

## 2016-05-03 ENCOUNTER — Ambulatory Visit (INDEPENDENT_AMBULATORY_CARE_PROVIDER_SITE_OTHER): Payer: 59 | Admitting: Orthopedic Surgery

## 2016-05-03 DIAGNOSIS — M722 Plantar fascial fibromatosis: Secondary | ICD-10-CM

## 2016-05-03 NOTE — Patient Instructions (Signed)
Continue intermittent use of ibuprofen Bracing at night Home exercises Ice therapy Cam Walker 6 weeks

## 2016-05-03 NOTE — Progress Notes (Signed)
Patient ID: John Wu, male   DOB: 1966/03/27, 50 y.o.   MRN: [id number]  Chief complaint is persistent pain plantar aspect right foot  HPI  The patient has had home exercises, cryotherapy, night splint, anti-inflammatories ibuprofen and Aleve and to injections into the right heel   ROS  No numbness or tingling in the foot at this time  There were no vitals taken for this visit. Gen. appearance is normal grooming and hygiene Orientation to person place and time normal Mood normal Gait is normal today No peripheral edema or swelling is noted in the right foot Sensory exam shows normal sensation to palpation, pressure and soft touch Skin exam no lacerations ulcerations or erythema  Ortho Exam  Tenderness in the plantar aspect of the right heel the nerve pain is improved after injection was placed more medially but patient still has pain in the evenings  A/P  Medical decision-making  Changed to Cam Walker for 6 weeks follow-up 6 weeks continue current treatment

## 2016-05-24 ENCOUNTER — Other Ambulatory Visit: Payer: Self-pay | Admitting: Family Medicine

## 2016-05-24 ENCOUNTER — Other Ambulatory Visit: Payer: Self-pay

## 2016-05-24 MED ORDER — LEVOTHYROXINE SODIUM 100 MCG PO TABS: 100.0000 ug | ORAL_TABLET | Freq: Every day | ORAL | Status: DC

## 2016-05-24 NOTE — Telephone Encounter (Signed)
Patient last seen 2015

## 2016-05-24 NOTE — Telephone Encounter (Signed)
Whoa not seen here for one and a half years, needs tsh t4  And ov for thyroid at least, 30 d only, if wants to expand to full phhysical do same plus lip liv m7 psa plus physical within next thirty d

## 2016-06-17 ENCOUNTER — Ambulatory Visit (INDEPENDENT_AMBULATORY_CARE_PROVIDER_SITE_OTHER): Payer: 59 | Admitting: Orthopedic Surgery

## 2016-06-17 ENCOUNTER — Encounter: Payer: Self-pay | Admitting: Orthopedic Surgery

## 2016-06-17 VITALS — BP 112/74 | HR 69 | Temp 97.7°F | Ht 70.0 in | Wt 187.2 lb

## 2016-06-17 DIAGNOSIS — M722 Plantar fascial fibromatosis: Secondary | ICD-10-CM | POA: Diagnosis not present

## 2016-06-17 NOTE — Patient Instructions (Signed)
The instructions were to continue night splint, exercises, ice. He can stop wearing the Cam Walker

## 2016-06-17 NOTE — Progress Notes (Signed)
Chief complaint pain right foot  Diagnosis plantar fasciitis right foot  Prior treatment: Ice, stretching, night splint, Cam Walker.  The patient still has some soreness. His start up pain in the mornings a little bit less but if he's been up on his feet all day he sits down he gets back up he feels a little more  Review of systems neurologic system normal musculoskeletal system normal  BP 112/74 mmHg  Pulse 69  Temp(Src) 97.7 F (36.5 C)  Ht 5\' 10"  (1.778 m)  Wt 187 lb 4 oz (84.936 kg)  BMI 26.87 kg/m2 He is awake alert and oriented 3 mood and affect are normal. His appearance is normal grooming hygiene normal. The right foot is not swollen and there no varicose veins the pulses and temperature are normal. He has some soreness at the plantar fascial insertion and also over the Baxter's nerve ankle is stable range of motion at the ankle joint normal  Encounter Diagnosis  Name Primary?  . Plantar fasciitis Yes    He will continue his night splint exercising and ice he can remove the Cam Walker. Follow-up 8 weeks.

## 2016-06-22 ENCOUNTER — Other Ambulatory Visit: Payer: Self-pay | Admitting: *Deleted

## 2016-06-22 ENCOUNTER — Telehealth: Payer: Self-pay | Admitting: Family Medicine

## 2016-06-22 ENCOUNTER — Encounter: Payer: Self-pay | Admitting: Internal Medicine

## 2016-06-22 DIAGNOSIS — Z13220 Encounter for screening for lipoid disorders: Secondary | ICD-10-CM

## 2016-06-22 DIAGNOSIS — E039 Hypothyroidism, unspecified: Secondary | ICD-10-CM

## 2016-06-22 DIAGNOSIS — Z139 Encounter for screening, unspecified: Secondary | ICD-10-CM

## 2016-06-22 DIAGNOSIS — Z125 Encounter for screening for malignant neoplasm of prostate: Secondary | ICD-10-CM

## 2016-06-22 NOTE — Telephone Encounter (Signed)
Same, fasting

## 2016-06-22 NOTE — Telephone Encounter (Signed)
Patient needs order for blood work.  He is trying to go later today after 3:30 when he gets off work.

## 2016-06-22 NOTE — Telephone Encounter (Signed)
Last bw 11/01/14 lipid, liver, bmp, psa, tsh

## 2016-06-22 NOTE — Telephone Encounter (Signed)
Orders ready. Pt notified that he needs to fast and cannot do today.

## 2016-06-28 ENCOUNTER — Ambulatory Visit: Payer: 59 | Admitting: Orthopedic Surgery

## 2016-07-04 LAB — HEPATIC FUNCTION PANEL
ALT: 22 IU/L (ref 0–44)
AST: 22 IU/L (ref 0–40)
Albumin: 4.1 g/dL (ref 3.5–5.5)
Alkaline Phosphatase: 78 IU/L (ref 39–117)
BILIRUBIN, DIRECT: 0.11 mg/dL (ref 0.00–0.40)
Bilirubin Total: 0.3 mg/dL (ref 0.0–1.2)
TOTAL PROTEIN: 5.9 g/dL — AB (ref 6.0–8.5)

## 2016-07-04 LAB — LIPID PANEL
CHOL/HDL RATIO: 4.1 ratio (ref 0.0–5.0)
Cholesterol, Total: 161 mg/dL (ref 100–199)
HDL: 39 mg/dL — AB (ref >39)
LDL CALC: 99 mg/dL (ref 0–99)
Triglycerides: 113 mg/dL (ref 0–149)
VLDL CHOLESTEROL CAL: 23 mg/dL (ref 5–40)

## 2016-07-04 LAB — BASIC METABOLIC PANEL
BUN/Creatinine Ratio: 14 (ref 9–20)
BUN: 15 mg/dL (ref 6–24)
CALCIUM: 9.3 mg/dL (ref 8.7–10.2)
CHLORIDE: 105 mmol/L (ref 96–106)
CO2: 23 mmol/L (ref 18–29)
CREATININE: 1.06 mg/dL (ref 0.76–1.27)
GFR calc Af Amer: 93 mL/min/{1.73_m2} (ref >59)
GFR calc non Af Amer: 81 mL/min/{1.73_m2} (ref >59)
GLUCOSE: 109 mg/dL — AB (ref 65–99)
Potassium: 4.6 mmol/L (ref 3.5–5.2)
Sodium: 145 mmol/L — ABNORMAL HIGH (ref 134–144)

## 2016-07-04 LAB — PSA: PROSTATE SPECIFIC AG, SERUM: 0.8 ng/mL (ref 0.0–4.0)

## 2016-07-04 LAB — TSH: TSH: 2.73 u[IU]/mL (ref 0.450–4.500)

## 2016-08-11 ENCOUNTER — Ambulatory Visit (INDEPENDENT_AMBULATORY_CARE_PROVIDER_SITE_OTHER): Payer: 59 | Admitting: Family Medicine

## 2016-08-11 ENCOUNTER — Encounter: Payer: Self-pay | Admitting: Family Medicine

## 2016-08-11 VITALS — BP 112/80 | Ht 69.0 in | Wt 184.5 lb

## 2016-08-11 DIAGNOSIS — Z0000 Encounter for general adult medical examination without abnormal findings: Secondary | ICD-10-CM | POA: Diagnosis not present

## 2016-08-11 DIAGNOSIS — R7303 Prediabetes: Secondary | ICD-10-CM | POA: Diagnosis not present

## 2016-08-11 DIAGNOSIS — E039 Hypothyroidism, unspecified: Secondary | ICD-10-CM | POA: Diagnosis not present

## 2016-08-11 MED ORDER — LEVOTHYROXINE SODIUM 100 MCG PO TABS: 100.0000 ug | ORAL_TABLET | Freq: Every day | ORAL | 11 refills | Status: DC

## 2016-08-11 NOTE — Patient Instructions (Signed)
Results for orders placed or performed in visit on 06/22/16  Lipid panel  Result Value Ref Range   Cholesterol, Total 161 100 - 199 mg/dL   Triglycerides 113 0 - 149 mg/dL   HDL 39 (L) >39 mg/dL   VLDL Cholesterol Cal 23 5 - 40 mg/dL   LDL Calculated 99 0 - 99 mg/dL   Chol/HDL Ratio 4.1 0.0 - 5.0 ratio units  Hepatic function panel  Result Value Ref Range   Total Protein 5.9 (L) 6.0 - 8.5 g/dL   Albumin 4.1 3.5 - 5.5 g/dL   Bilirubin Total 0.3 0.0 - 1.2 mg/dL   Bilirubin, Direct 0.11 0.00 - 0.40 mg/dL   Alkaline Phosphatase 78 39 - 117 IU/L   AST 22 0 - 40 IU/L   ALT 22 0 - 44 IU/L  Basic metabolic panel  Result Value Ref Range   Glucose 109 (H) 65 - 99 mg/dL   BUN 15 6 - 24 mg/dL   Creatinine, Ser 1.06 0.76 - 1.27 mg/dL   GFR calc non Af Amer 81 >59 mL/min/1.73   GFR calc Af Amer 93 >59 mL/min/1.73   BUN/Creatinine Ratio 14 9 - 20   Sodium 145 (H) 134 - 144 mmol/L   Potassium 4.6 3.5 - 5.2 mmol/L   Chloride 105 96 - 106 mmol/L   CO2 23 18 - 29 mmol/L   Calcium 9.3 8.7 - 10.2 mg/dL  PSA  Result Value Ref Range   Prostate Specific Ag, Serum 0.8 0.0 - 4.0 ng/mL  TSH  Result Value Ref Range   TSH 2.730 0.450 - 4.500 uIU/mL   Prediabetes Eating Plan Prediabetes--also called impaired glucose tolerance or impaired fasting glucose--is a condition that causes blood sugar (blood glucose) levels to be higher than normal. Following a healthy diet can help to keep prediabetes under control. It can also help to lower the risk of type 2 diabetes and heart disease, which are increased in people who have prediabetes. Along with regular exercise, a healthy diet:  Promotes weight loss.  Helps to control blood sugar levels.  Helps to improve the way that the body uses insulin. WHAT DO I NEED TO KNOW ABOUT THIS EATING PLAN?  Use the glycemic index (GI) to plan your meals. The index tells you how quickly a food will raise your blood sugar. Choose low-GI foods. These foods take a longer  time to raise blood sugar.  Pay close attention to the amount of carbohydrates in the food that you eat. Carbohydrates increase blood sugar levels.  Keep track of how many calories you take in. Eating the right amount of calories will help you to achieve a healthy weight. Losing about 7 percent of your starting weight can help to prevent type 2 diabetes.  You may want to follow a Mediterranean diet. This diet includes a lot of vegetables, lean meats or fish, whole grains, fruits, and healthy oils and fats. WHAT FOODS CAN I EAT? Grains Whole grains, such as whole-wheat or whole-grain breads, crackers, cereals, and pasta. Unsweetened oatmeal. Bulgur. Barley. Quinoa. Brown rice. Corn or whole-wheat flour tortillas or taco shells. Vegetables Lettuce. Spinach. Peas. Beets. Cauliflower. Cabbage. Broccoli. Carrots. Tomatoes. Squash. Eggplant. Herbs. Peppers. Onions. Cucumbers. Brussels sprouts. Fruits Berries. Bananas. Apples. Oranges. Grapes. Papaya. Mango. Pomegranate. Kiwi. Grapefruit. Cherries. Meats and Other Protein Sources Seafood. Lean meats, such as chicken and [place] or lean cuts of pork and beef. Tofu. Eggs. Nuts. Beans. Dairy Low-fat or fat-free dairy products, such as yogurt, cottage cheese,  and cheese. Beverages Water. Tea. Coffee. Sugar-free or diet soda. Seltzer water. Milk. Milk alternatives, such as soy or almond milk. Condiments Mustard. Relish. Low-fat, low-sugar ketchup. Low-fat, low-sugar barbecue sauce. Low-fat or fat-free mayonnaise. Sweets and Desserts Sugar-free or low-fat pudding. Sugar-free or low-fat ice cream and other frozen treats. Fats and Oils Avocado. Walnuts. Olive oil. The items listed above may not be a complete list of recommended foods or beverages. Contact your dietitian for more options.  WHAT FOODS ARE NOT RECOMMENDED? Grains Refined white flour and flour products, such as bread, pasta, snack foods, and cereals. Beverages Sweetened drinks, such as  sweet iced tea and soda. Sweets and Desserts Baked goods, such as cake, cupcakes, pastries, cookies, and cheesecake. The items listed above may not be a complete list of foods and beverages to avoid. Contact your dietitian for more information.   This information is not intended to replace advice given to you by your health care provider. Make sure you discuss any questions you have with your health care provider.   Document Released: 04/15/2015 Document Reviewed: 04/15/2015 Elsevier Interactive Patient Education [employer].

## 2016-08-11 NOTE — Progress Notes (Signed)
Subjective:     Patient ID: John Wu, male   DOB: [birth year], 50 y.o.   MRN: [id number]  HPI The patient comes in today for a wellness visit.  Thyroid   A review of their health history was completed.  A review of medications was also completed.  Any needed refills: yes  Eating habits: good  Falls/  MVA accidents in past few months: none  Regular exercise: walks on treadmill/neighborhood 2-5 miles  Specialist pt sees on regular basis: none  Preventative health issues were discussed.   Additional concerns: none  Last colonoscopy: scheduled for 09/01/16  Results for orders placed or performed in visit on 06/22/16  Lipid panel  Result Value Ref Range   Cholesterol, Total 161 100 - 199 mg/dL   Triglycerides 113 0 - 149 mg/dL   HDL 39 (L) >39 mg/dL   VLDL Cholesterol Cal 23 5 - 40 mg/dL   LDL Calculated 99 0 - 99 mg/dL   Chol/HDL Ratio 4.1 0.0 - 5.0 ratio units  Hepatic function panel  Result Value Ref Range   Total Protein 5.9 (L) 6.0 - 8.5 g/dL   Albumin 4.1 3.5 - 5.5 g/dL   Bilirubin Total 0.3 0.0 - 1.2 mg/dL   Bilirubin, Direct 0.11 0.00 - 0.40 mg/dL   Alkaline Phosphatase 78 39 - 117 IU/L   AST 22 0 - 40 IU/L   ALT 22 0 - 44 IU/L  Basic metabolic panel  Result Value Ref Range   Glucose 109 (H) 65 - 99 mg/dL   BUN 15 6 - 24 mg/dL   Creatinine, Ser 1.06 0.76 - 1.27 mg/dL   GFR calc non Af Amer 81 >59 mL/min/1.73   GFR calc Af Amer 93 >59 mL/min/1.73   BUN/Creatinine Ratio 14 9 - 20   Sodium 145 (H) 134 - 144 mmol/L   Potassium 4.6 3.5 - 5.2 mmol/L   Chloride 105 96 - 106 mmol/L   CO2 23 18 - 29 mmol/L   Calcium 9.3 8.7 - 10.2 mg/dL  PSA  Result Value Ref Range   Prostate Specific Ag, Serum 0.8 0.0 - 4.0 ng/mL  TSH  Result Value Ref Range   TSH 2.730 0.450 - 4.500 uIU/mL                        Review of Systems  Constitutional: Negative for activity change, appetite change and fever.  HENT: Negative for congestion and  rhinorrhea.   Eyes: Negative for discharge.  Respiratory: Negative for cough and wheezing.   Cardiovascular: Negative for chest pain.  Gastrointestinal: Negative for abdominal pain, blood in stool and vomiting.  Genitourinary: Negative for difficulty urinating and frequency.  Musculoskeletal: Negative for neck pain.  Skin: Negative for rash.  Allergic/Immunologic: Negative for environmental allergies and food allergies.  Neurological: Negative for weakness and headaches.  Psychiatric/Behavioral: Negative for agitation.  All other systems reviewed and are negative.      Objective:   Physical Exam  Constitutional: He appears well-developed and well-nourished.  HENT:  Head: Normocephalic and atraumatic.  Right Ear: External ear normal.  Left Ear: External ear normal.  Nose: Nose normal.  Mouth/Throat: Oropharynx is clear and moist.  Eyes: EOM are normal. Pupils are equal, round, and reactive to light.  Neck: Normal range of motion. Neck supple. No thyromegaly present.  Cardiovascular: Normal rate, regular rhythm and normal heart sounds.   No murmur heard. Pulmonary/Chest: Effort normal and breath sounds normal. No respiratory  distress. He has no wheezes.  Abdominal: Soft. Bowel sounds are normal. He exhibits no distension and no mass. There is no tenderness.  Genitourinary: Penis normal.  Musculoskeletal: Normal range of motion. He exhibits no edema.  Lymphadenopathy:    He has no cervical adenopathy.  Neurological: He is alert. He exhibits normal muscle tone.  Skin: Skin is warm and dry. No erythema.  Psychiatric: He has a normal mood and affect. His behavior is normal. Judgment normal.  Vitals reviewed.      Assessment:     #1 well adult exam.atient due to have colonoscopy soon. Exce Very good diet. #2 impaired fasting glucose/prediabetes discussed. New diagnosis for patient. #3 hypothyroidism. No symptoms of high or low thyroid. TSH excellent. To maintain same ddiscussed  plan thyroid written for 1 year. Prediabetes education given. Patient to press on and get colonosco    Plan:

## 2016-08-18 ENCOUNTER — Ambulatory Visit (AMBULATORY_SURGERY_CENTER): Payer: Self-pay

## 2016-08-18 VITALS — Ht 69.0 in | Wt 190.8 lb

## 2016-08-18 DIAGNOSIS — Z1211 Encounter for screening for malignant neoplasm of colon: Secondary | ICD-10-CM

## 2016-08-18 MED ORDER — SUPREP BOWEL PREP KIT 17.5-3.13-1.6 GM/177ML PO SOLN: 1.0000 | Freq: Once | ORAL | 0 refills | Status: AC

## 2016-08-18 NOTE — Progress Notes (Signed)
No allergies to eggs or soy No past problems with anesthesia No diet meds No home oxygen  Declined emmi 

## 2016-08-19 ENCOUNTER — Encounter: Payer: Self-pay | Admitting: Internal Medicine

## 2016-08-19 ENCOUNTER — Ambulatory Visit: Payer: 59 | Admitting: Orthopedic Surgery

## 2016-09-01 ENCOUNTER — Encounter: Payer: Self-pay | Admitting: Internal Medicine

## 2016-09-01 ENCOUNTER — Other Ambulatory Visit: Payer: Self-pay

## 2016-09-01 ENCOUNTER — Ambulatory Visit (AMBULATORY_SURGERY_CENTER): Payer: 59 | Admitting: Internal Medicine

## 2016-09-01 VITALS — BP 105/71 | HR 61 | Temp 98.0°F | Resp 7 | Ht 69.0 in | Wt 190.0 lb

## 2016-09-01 DIAGNOSIS — Z1211 Encounter for screening for malignant neoplasm of colon: Secondary | ICD-10-CM

## 2016-09-01 DIAGNOSIS — D122 Benign neoplasm of ascending colon: Secondary | ICD-10-CM

## 2016-09-01 DIAGNOSIS — Z1212 Encounter for screening for malignant neoplasm of rectum: Secondary | ICD-10-CM | POA: Diagnosis not present

## 2016-09-01 MED ORDER — SODIUM CHLORIDE 0.9 % IV SOLN: 500.0000 mL | INTRAVENOUS | Status: DC

## 2016-09-01 MED ORDER — LEVOTHYROXINE SODIUM 100 MCG PO TABS: 100.0000 ug | ORAL_TABLET | Freq: Every day | ORAL | 3 refills | Status: DC

## 2016-09-01 NOTE — Progress Notes (Signed)
To pacu vss patent aw reprot to rn 

## 2016-09-01 NOTE — Op Note (Signed)
[place] Patient Name: John Wu Procedure Date: 09/01/2016 9:52 AM MRN: [id number] Endoscopist: Docia Chuck. Henrene Pastor , MD Age: 50 Referring MD:  Date of Birth: May 11, 1966 Gender: Male Account #: [id number] Procedure:                Colonoscopy, with cold snare polypectomy x 1 Indications:              Screening for colorectal malignant neoplasm Medicines:                Monitored Anesthesia Care Procedure:                Pre-Anesthesia Assessment:                           - Prior to the procedure, a History and Physical                            was performed, and patient medications and                            allergies were reviewed. The patient's tolerance of                            previous anesthesia was also reviewed. The risks                            and benefits of the procedure and the sedation                            options and risks were discussed with the patient.                            All questions were answered, and informed consent                            was obtained. Prior Anticoagulants: The patient has                            taken no previous anticoagulant or antiplatelet                            agents. ASA Grade Assessment: II - A patient with                            mild systemic disease. After reviewing the risks                            and benefits, the patient was deemed in                            satisfactory condition to undergo the procedure.                           After obtaining informed consent, the colonoscope  was passed under direct vision. Throughout the                            procedure, the patient's blood pressure, pulse, and                            oxygen saturations were monitored continuously. The                            Model PCF-H190DL [id number]) scope was introduced                            through the anus and advanced to the the cecum,                 identified by appendiceal orifice and ileocecal                            valve. The ileocecal valve, appendiceal orifice,                            and rectum were photographed. The quality of the                            bowel preparation was good. The colonoscopy was                            performed without difficulty. The patient tolerated                            the procedure well. The bowel preparation used was                            SUPREP. Scope In: 9:55:44 AM Scope Out: 10:12:30 AM Scope Withdrawal Time: 0 hours 14 minutes 26 seconds  Total Procedure Duration: 0 hours 16 minutes 46 seconds  Findings:                 A 4 mm polyp was found in the ascending colon. The                            polyp was removed with a cold snare. Resection and                            retrieval were complete.                           A few small-mouthed diverticula were found in the                            sigmoid colon.                           Internal hemorrhoids were found during retroflexion.  The exam was otherwise without abnormality on                            direct and retroflexion views. Complications:            No immediate complications. Estimated blood loss:                            None. Estimated Blood Loss:     Estimated blood loss: none. Impression:               - One 4 mm polyp in the ascending colon, removed                            with a cold snare. Resected and retrieved.                           - Diverticulosis in the sigmoid colon.                           - Internal hemorrhoids.                           - The examination was otherwise normal on direct                            and retroflexion views. Recommendation:           - Repeat colonoscopy in 5-10 years for surveillance.                           - Patient has a contact number available for                            emergencies. The signs  and symptoms of potential                            delayed complications were discussed with the                            patient. Return to normal activities tomorrow.                            Written discharge instructions were provided to the                            patient.                           - Resume previous diet.                           - Continue present medications.                           - Await pathology results. Docia Chuck. Henrene Pastor, MD 09/01/2016 10:16:27 AM This report has been signed electronically.

## 2016-09-01 NOTE — Patient Instructions (Signed)
Discharge instructions given. Handouts on polyps,diverticulosis and hemorrhoids. Resume previous medications. YOU HAD AN ENDOSCOPIC PROCEDURE TODAY AT THE [facility]:   Refer to the procedure report that was given to you for any specific questions about what was found during the examination.  If the procedure report does not answer your questions, please call your gastroenterologist to clarify.  If you requested that your care partner not be given the details of your procedure findings, then the procedure report has been included in a sealed envelope for you to review at your convenience later.  YOU SHOULD EXPECT: Some feelings of bloating in the abdomen. Passage of more gas than usual.  Walking can help get rid of the air that was put into your GI tract during the procedure and reduce the bloating. If you had a lower endoscopy (such as a colonoscopy or flexible sigmoidoscopy) you may notice spotting of blood in your stool or on the toilet paper. If you underwent a bowel prep for your procedure, you may not have a normal bowel movement for a few days.  Please Note:  You might notice some irritation and congestion in your nose or some drainage.  This is from the oxygen used during your procedure.  There is no need for concern and it should clear up in a day or so.  SYMPTOMS TO REPORT IMMEDIATELY:   Following lower endoscopy (colonoscopy or flexible sigmoidoscopy):  Excessive amounts of blood in the stool  Significant tenderness or worsening of abdominal pains  Swelling of the abdomen that is new, acute  Fever of 100F or higher   For urgent or emergent issues, a gastroenterologist can be reached at any hour by calling [phone].   DIET:  We do recommend a small meal at first, but then you may proceed to your regular diet.  Drink plenty of fluids but you should avoid alcoholic beverages for 24 hours.  ACTIVITY:  You should plan to take it easy for the rest of today and you  should NOT DRIVE or use heavy machinery until tomorrow (because of the sedation medicines used during the test).    FOLLOW UP: Our staff will call the number listed on your records the next business day following your procedure to check on you and address any questions or concerns that you may have regarding the information given to you following your procedure. If we do not reach you, we will leave a message.  However, if you are feeling well and you are not experiencing any problems, there is no need to return our call.  We will assume that you have returned to your regular daily activities without incident.  If any biopsies were taken you will be contacted by phone or by letter within the next 1-3 weeks.  Please call us at [phone] if you have not heard about the biopsies in 3 weeks.    SIGNATURES/CONFIDENTIALITY: You and/or your care partner have signed paperwork which will be entered into your electronic medical record.  These signatures attest to the fact that that the information above on your After Visit Summary has been reviewed and is understood.  Full responsibility of the confidentiality of this discharge information lies with you and/or your care-partner. 

## 2016-09-01 NOTE — Progress Notes (Signed)
Called to room to assist during endoscopic procedure.  Patient ID and intended procedure confirmed with present staff. Received instructions for my participation in the procedure from the performing physician.  

## 2016-09-02 ENCOUNTER — Telehealth: Payer: Self-pay | Admitting: *Deleted

## 2016-09-02 NOTE — Telephone Encounter (Signed)
  Follow up Call-  Call back number 09/01/2016  Post procedure Call Back phone  # [phone]  Permission to leave phone message Yes  Some recent data might be hidden     Patient questions:  Do you have a fever, pain , or abdominal swelling? No. Pain Score  0 *  Have you tolerated food without any problems? Yes.    Have you been able to return to your normal activities? Yes.    Do you have any questions about your discharge instructions: Diet   No. Medications  No. Follow up visit  No.  Do you have questions or concerns about your Care? No.  Actions: * If pain score is 4 or above: No action needed, pain <4.

## 2016-09-03 ENCOUNTER — Other Ambulatory Visit: Payer: Self-pay

## 2016-09-03 MED ORDER — LEVOTHYROXINE SODIUM 100 MCG PO TABS: 100.0000 ug | ORAL_TABLET | Freq: Every day | ORAL | 3 refills | Status: DC

## 2016-09-06 ENCOUNTER — Encounter: Payer: Self-pay | Admitting: Internal Medicine

## 2016-09-10 ENCOUNTER — Telehealth: Payer: Self-pay | Admitting: Internal Medicine

## 2016-09-10 NOTE — Telephone Encounter (Signed)
Spoke with pt, results letter reviewed with pt and questions were answered.

## 2017-08-29 ENCOUNTER — Other Ambulatory Visit: Payer: Self-pay | Admitting: Family Medicine

## 2017-08-29 NOTE — Telephone Encounter (Signed)
Call pt, wellness plus chronic plz sched. Also lip liv m7 psa and tsh.  Fasting. Then may ref med times one

## 2017-09-01 ENCOUNTER — Telehealth: Payer: Self-pay | Admitting: Family Medicine

## 2017-09-01 DIAGNOSIS — E039 Hypothyroidism, unspecified: Secondary | ICD-10-CM

## 2017-09-01 DIAGNOSIS — Z125 Encounter for screening for malignant neoplasm of prostate: Secondary | ICD-10-CM

## 2017-09-01 DIAGNOSIS — Z13220 Encounter for screening for lipoid disorders: Secondary | ICD-10-CM

## 2017-09-01 DIAGNOSIS — Z0000 Encounter for general adult medical examination without abnormal findings: Secondary | ICD-10-CM

## 2017-09-01 NOTE — Telephone Encounter (Signed)
Lip liv m7 psa tsh

## 2017-09-01 NOTE — Telephone Encounter (Signed)
Patient has physical on 10/3 and needing labs done.

## 2017-09-01 NOTE — Telephone Encounter (Signed)
Left message to return call. bloodwork orders are ready 

## 2017-09-01 NOTE — Telephone Encounter (Signed)
Spoke with patient and informed her per Dr.Steve Crosby have been ordered for your upcoming appointment. Patient verbalized understanding.

## 2017-09-07 DIAGNOSIS — Z125 Encounter for screening for malignant neoplasm of prostate: Secondary | ICD-10-CM | POA: Diagnosis not present

## 2017-09-07 DIAGNOSIS — Z0000 Encounter for general adult medical examination without abnormal findings: Secondary | ICD-10-CM | POA: Diagnosis not present

## 2017-09-07 DIAGNOSIS — E039 Hypothyroidism, unspecified: Secondary | ICD-10-CM | POA: Diagnosis not present

## 2017-09-07 DIAGNOSIS — Z13220 Encounter for screening for lipoid disorders: Secondary | ICD-10-CM | POA: Diagnosis not present

## 2017-09-08 LAB — LIPID PANEL
CHOL/HDL RATIO: 4.1 ratio (ref 0.0–5.0)
Cholesterol, Total: 179 mg/dL (ref 100–199)
HDL: 44 mg/dL (ref >39)
LDL CALC: 111 mg/dL — AB (ref 0–99)
Triglycerides: 121 mg/dL (ref 0–149)
VLDL CHOLESTEROL CAL: 24 mg/dL (ref 5–40)

## 2017-09-08 LAB — BASIC METABOLIC PANEL
BUN/Creatinine Ratio: 11 (ref 9–20)
BUN: 12 mg/dL (ref 6–24)
CALCIUM: 9.7 mg/dL (ref 8.7–10.2)
CO2: 28 mmol/L (ref 20–29)
CREATININE: 1.11 mg/dL (ref 0.76–1.27)
Chloride: 103 mmol/L (ref 96–106)
GFR calc Af Amer: 88 mL/min/{1.73_m2} (ref >59)
GFR calc non Af Amer: 76 mL/min/{1.73_m2} (ref >59)
Glucose: 108 mg/dL — ABNORMAL HIGH (ref 65–99)
Potassium: 5.2 mmol/L (ref 3.5–5.2)
SODIUM: 143 mmol/L (ref 134–144)

## 2017-09-08 LAB — TSH: TSH: 3.7 u[IU]/mL (ref 0.450–4.500)

## 2017-09-08 LAB — HEPATIC FUNCTION PANEL
ALBUMIN: 4.4 g/dL (ref 3.5–5.5)
ALT: 20 IU/L (ref 0–44)
AST: 18 IU/L (ref 0–40)
Alkaline Phosphatase: 69 IU/L (ref 39–117)
Bilirubin Total: 0.5 mg/dL (ref 0.0–1.2)
Bilirubin, Direct: 0.13 mg/dL (ref 0.00–0.40)
TOTAL PROTEIN: 6.1 g/dL (ref 6.0–8.5)

## 2017-09-08 LAB — PSA: Prostate Specific Ag, Serum: 1 ng/mL (ref 0.0–4.0)

## 2017-09-14 ENCOUNTER — Ambulatory Visit (INDEPENDENT_AMBULATORY_CARE_PROVIDER_SITE_OTHER): Payer: 59 | Admitting: Family Medicine

## 2017-09-14 ENCOUNTER — Encounter: Payer: Self-pay | Admitting: Family Medicine

## 2017-09-14 VITALS — BP 118/76 | Ht 70.0 in | Wt 185.4 lb

## 2017-09-14 DIAGNOSIS — E039 Hypothyroidism, unspecified: Secondary | ICD-10-CM

## 2017-09-14 DIAGNOSIS — R7303 Prediabetes: Secondary | ICD-10-CM

## 2017-09-14 DIAGNOSIS — Z0000 Encounter for general adult medical examination without abnormal findings: Secondary | ICD-10-CM

## 2017-09-14 MED ORDER — LEVOTHYROXINE SODIUM 112 MCG PO TABS: 112.0000 ug | ORAL_TABLET | Freq: Every day | ORAL | 3 refills | Status: DC

## 2017-09-14 NOTE — Progress Notes (Signed)
Subjective:    Patient ID: John Wu, male    DOB: 1966/07/18, 51 y.o.   MRN: [id number]  HPI                                                                                                                                                                                                     The patient comes in today for a wellness visit.    A review of their health history was completed.  A review of medications was also completed.  Any needed refills; yes  Eating habits: trying to  Falls/  MVA accidents in past few months: none  Regular exercise: yes- 5 miles a day walking  Specialist pt sees on regular basis: no  Preventative health issues were discussed.   Additional concerns: none  Diet overall pretty good    Reg phys activity doing well  Walked a lot, staying active five mi per day   Results for orders placed or performed in visit on 09/01/17  Lipid panel  Result Value Ref Range   Cholesterol, Total 179 100 - 199 mg/dL   Triglycerides 121 0 - 149 mg/dL   HDL 44 >39 mg/dL   VLDL Cholesterol Cal 24 5 - 40 mg/dL   LDL Calculated 111 (H) 0 - 99 mg/dL   Chol/HDL Ratio 4.1 0.0 - 5.0 ratio  Hepatic function panel  Result Value Ref Range   Total Protein 6.1 6.0 - 8.5 g/dL   Albumin 4.4 3.5 - 5.5 g/dL   Bilirubin Total 0.5 0.0 - 1.2 mg/dL   Bilirubin, Direct 0.13 0.00 - 0.40 mg/dL   Alkaline Phosphatase 69 39 - 117 IU/L   AST 18 0 - 40 IU/L   ALT 20 0 - 44 IU/L  Basic metabolic panel  Result Value Ref Range   Glucose 108 (H) 65 - 99 mg/dL   BUN 12 6 - 24 mg/dL   Creatinine, Ser 1.11 0.76 - 1.27 mg/dL   GFR calc non Af Amer 76 >59 mL/min/1.73   GFR calc Af Amer 88 >59 mL/min/1.73   BUN/Creatinine Ratio 11 9 - 20   Sodium 143 134 - 144 mmol/L   Potassium 5.2 3.5  - 5.2 mmol/L   Chloride 103 96 - 106 mmol/L   CO2 28 20 - 29 mmol/L   Calcium 9.7 8.7 - 10.2 mg/dL  PSA  Result Value Ref Range   Prostate Specific Ag, Serum 1.0 0.0 - 4.0 ng/mL  TSH  Result Value Ref Range   TSH 3.700 0.450 - 4.500 uIU/mL   Sept 2017  Need to incr the thyroid pill   Review of Systems  Constitutional: Negative for activity change, appetite change and fever.  HENT: Negative for congestion and rhinorrhea.   Eyes: Negative for discharge.  Respiratory: Negative for cough and wheezing.   Cardiovascular: Negative for chest pain.  Gastrointestinal: Negative for abdominal pain, blood in stool and vomiting.  Genitourinary: Negative for difficulty urinating and frequency.  Musculoskeletal: Negative for neck pain.  Skin: Negative for rash.  Allergic/Immunologic: Negative for environmental allergies and food allergies.  Neurological: Negative for weakness and headaches.  Psychiatric/Behavioral: Negative for agitation.  All other systems reviewed and are negative.      Objective:   Physical Exam  Constitutional: He appears well-developed and well-nourished.  HENT:  Head: Normocephalic and atraumatic.  Right Ear: External ear normal.  Left Ear: External ear normal.  Nose: Nose normal.  Mouth/Throat: Oropharynx is clear and moist.  Eyes: Pupils are equal, round, and reactive to light. EOM are normal.  Neck: Normal range of motion. Neck supple. No thyromegaly present.  Cardiovascular: Normal rate, regular rhythm and normal heart sounds.   No murmur heard. Pulmonary/Chest: Effort normal and breath sounds normal. No respiratory distress. He has no wheezes.  Abdominal: Soft. Bowel sounds are normal. He exhibits no distension and no mass. There is no tenderness.  Genitourinary: Penis normal.  Musculoskeletal: Normal range of motion. He exhibits no edema.  Lymphadenopathy:    He has no cervical adenopathy.  Neurological: He is alert. He exhibits normal muscle tone.    Skin: Skin is warm and dry. No erythema.  Psychiatric: He has a normal mood and affect. His behavior is normal. Judgment normal.  Vitals reviewed.         Assessment & Plan:  Impression 1 wellness exam. Up-to-date on colonoscopy. Declines flu shot. Diet exercise discussed. Blood work reviewed  #2 hypothyroidism TSH rising somewhat into threes patient would like to tighten this up which is reasonable. Discuss increase 112 g. #3 prediabetes clinically stable sugar remains similar. Discussed

## 2017-09-14 NOTE — Patient Instructions (Signed)
Results for orders placed or performed in visit on 09/01/17  Lipid panel  Result Value Ref Range   Cholesterol, Total 179 100 - 199 mg/dL   Triglycerides 121 0 - 149 mg/dL   HDL 44 >39 mg/dL   VLDL Cholesterol Cal 24 5 - 40 mg/dL   LDL Calculated 111 (H) 0 - 99 mg/dL   Chol/HDL Ratio 4.1 0.0 - 5.0 ratio  Hepatic function panel  Result Value Ref Range   Total Protein 6.1 6.0 - 8.5 g/dL   Albumin 4.4 3.5 - 5.5 g/dL   Bilirubin Total 0.5 0.0 - 1.2 mg/dL   Bilirubin, Direct 0.13 0.00 - 0.40 mg/dL   Alkaline Phosphatase 69 39 - 117 IU/L   AST 18 0 - 40 IU/L   ALT 20 0 - 44 IU/L  Basic metabolic panel  Result Value Ref Range   Glucose 108 (H) 65 - 99 mg/dL   BUN 12 6 - 24 mg/dL   Creatinine, Ser 1.11 0.76 - 1.27 mg/dL   GFR calc non Af Amer 76 >59 mL/min/1.73   GFR calc Af Amer 88 >59 mL/min/1.73   BUN/Creatinine Ratio 11 9 - 20   Sodium 143 134 - 144 mmol/L   Potassium 5.2 3.5 - 5.2 mmol/L   Chloride 103 96 - 106 mmol/L   CO2 28 20 - 29 mmol/L   Calcium 9.7 8.7 - 10.2 mg/dL  PSA  Result Value Ref Range   Prostate Specific Ag, Serum 1.0 0.0 - 4.0 ng/mL  TSH  Result Value Ref Range   TSH 3.700 0.450 - 4.500 uIU/mL

## 2017-12-16 ENCOUNTER — Other Ambulatory Visit: Payer: Self-pay | Admitting: *Deleted

## 2017-12-16 MED ORDER — LEVOTHYROXINE SODIUM 112 MCG PO TABS: 112.0000 ug | ORAL_TABLET | Freq: Every day | ORAL | 1 refills | Status: DC

## 2018-01-20 ENCOUNTER — Ambulatory Visit (INDEPENDENT_AMBULATORY_CARE_PROVIDER_SITE_OTHER): Payer: 59

## 2018-01-20 ENCOUNTER — Ambulatory Visit (INDEPENDENT_AMBULATORY_CARE_PROVIDER_SITE_OTHER): Payer: 59 | Admitting: Orthopedic Surgery

## 2018-01-20 ENCOUNTER — Encounter: Payer: Self-pay | Admitting: Orthopedic Surgery

## 2018-01-20 VITALS — BP 121/79 | HR 66 | Ht 70.0 in | Wt 185.0 lb

## 2018-01-20 DIAGNOSIS — M25561 Pain in right knee: Secondary | ICD-10-CM

## 2018-01-20 DIAGNOSIS — G8929 Other chronic pain: Secondary | ICD-10-CM

## 2018-01-20 DIAGNOSIS — M1711 Unilateral primary osteoarthritis, right knee: Secondary | ICD-10-CM | POA: Diagnosis not present

## 2018-01-20 NOTE — Progress Notes (Signed)
Progress Note   Patient ID: John Wu, male   DOB: December 05, 1966, 52 y.o.   MRN: [id number]  Chief Complaint  Patient presents with  . Knee Pain    right     52 YO MALE WITH 6 WEEKS H/O right knee pain with no history of trauma.  He says he notices sometimes that the knee feels tight and when he sits down and stands back up or bends knee feels a pop.  His pain is on the medial aspect somewhat along the joint line and some what along the femoral patellar area  Otherwise the pain is best described as dull intermittent mild does not stop him from doing anything he walks 5 miles a day divided between the morning and afternoon     Review of Systems  Constitutional: Negative for fever.  Respiratory: Negative.   Cardiovascular: Negative.   Skin: Negative.   Neurological: Negative for tingling.   Current Meds  Medication Sig  . levothyroxine (SYNTHROID, LEVOTHROID) 112 MCG tablet Take 1 tablet (112 mcg total) by mouth daily.    Past Medical History:  Diagnosis Date  . Hypothyroidism   . IBS (irritable bowel syndrome)      No Known Allergies  BP 121/79   Pulse 66   Ht 5\' 10"  (1.778 m)   Wt 185 lb (83.9 kg)   BMI 26.54 kg/m    Physical Exam  Constitutional: He is oriented to person, place, and time. He appears well-developed and well-nourished.  Vital signs have been reviewed and are stable. Gen. appearance the patient is well-developed and well-nourished with normal grooming and hygiene.   Musculoskeletal:  GAIT IS normal  Standing view from the front no evidence of significant varus or valgus malalignment  Neurological: He is alert and oriented to person, place, and time.  Skin: Skin is warm and dry. No erythema.  Psychiatric: He has a normal mood and affect.  Vitals reviewed.   Right Knee Exam   Muscle Strength  The patient has normal right knee strength.  Tenderness  Right knee tenderness location: Tenderness at the patellofemoral area medially and mild  at the posterior medial corner of the joint line.  Range of Motion  Extension: normal  Flexion: normal   Tests  McMurray:  Medial - negative Lateral - negative Varus: negative Valgus: negative Drawer:  Anterior - negative    Posterior - negative  Other  Erythema: absent Scars: absent Sensation: normal Pulse: present Swelling: none   Left Knee Exam   Muscle Strength  The patient has normal left knee strength.  Tenderness  The patient is experiencing no tenderness.   Range of Motion  Extension: normal  Flexion: normal   Tests  McMurray:  Medial - negative Lateral - negative Varus: negative Valgus: negative Drawer:  Anterior - negative     Posterior - negative  Other  Erythema: absent Scars: absent Sensation: normal Pulse: present Swelling: none       Medical decision-making  Imaging: Today the x-rays I read as mild arthritis with mild joint space narrowing symmetrically and mild spur in the patellofemoral joint medially  Encounter Diagnoses  Name Primary?  . Chronic pain of right knee   . Primary osteoarthritis of right knee Yes   I do not see anything worth getting worried about at this point he has some mild arthritis in the knee he has no real mechanical signs at this point so he can continue exercises tolerated he will follow-up if things get  worse or he starts to get catching or locking  Arther Abbott, MD 01/20/2018 11:55 AM

## 2018-03-08 ENCOUNTER — Ambulatory Visit (HOSPITAL_COMMUNITY)
Admission: RE | Admit: 2018-03-08 | Discharge: 2018-03-08 | Disposition: A | Discharge status: Home / Self Care | Payer: 59 | Source: Ambulatory Visit | Attending: Family Medicine | Admitting: Family Medicine

## 2018-03-08 ENCOUNTER — Ambulatory Visit (INDEPENDENT_AMBULATORY_CARE_PROVIDER_SITE_OTHER): Payer: 59 | Admitting: Family Medicine

## 2018-03-08 ENCOUNTER — Encounter: Payer: Self-pay | Admitting: Family Medicine

## 2018-03-08 VITALS — BP 118/76 | Ht 70.0 in | Wt 183.4 lb

## 2018-03-08 DIAGNOSIS — R1011 Right upper quadrant pain: Secondary | ICD-10-CM | POA: Diagnosis not present

## 2018-03-08 DIAGNOSIS — R079 Chest pain, unspecified: Secondary | ICD-10-CM | POA: Diagnosis not present

## 2018-03-08 DIAGNOSIS — R109 Unspecified abdominal pain: Secondary | ICD-10-CM

## 2018-03-08 LAB — POCT URINALYSIS DIPSTICK
PH UA: 6 (ref 5.0–8.0)
SPEC GRAV UA: 1.015 (ref 1.010–1.025)

## 2018-03-08 NOTE — Progress Notes (Signed)
Subjective:    Patient ID: John Wu, male    DOB: 1966/09/07, 52 y.o.   MRN: [id number]  HPI  Patient arrives with back pain that goes into his side at times for 2 weeks.  Two weeks ago or so felt some right lat back pain , couple weeks back  Notes intermittent pains that occurred elsewhere, calls it "phantom pains", lasts not long    Last night had some right ant chest pain .  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  Does four times per wk    Pt on iternet search and wondering about diverticulitis and appendicitis and kidney stones  Results for orders placed or performed in visit on 09/01/17  Lipid panel  Result Value Ref Range   Cholesterol, Total 179 100 - 199 mg/dL   Triglycerides 121 0 - 149 mg/dL   HDL 44 >39 mg/dL   VLDL Cholesterol Cal 24 5 - 40 mg/dL   LDL Calculated 111 (H) 0 - 99 mg/dL   Chol/HDL Ratio 4.1 0.0 - 5.0 ratio  Hepatic function panel  Result Value Ref Range   Total Protein 6.1 6.0 - 8.5 g/dL   Albumin 4.4 3.5 - 5.5 g/dL   Bilirubin Total 0.5 0.0 - 1.2 mg/dL   Bilirubin, Direct 0.13 0.00 - 0.40 mg/dL   Alkaline Phosphatase 69 39 - 117 IU/L   AST 18 0 - 40 IU/L   ALT 20 0 - 44 IU/L  Basic metabolic panel  Result Value Ref Range   Glucose 108 (H) 65 - 99 mg/dL   BUN 12 6 - 24 mg/dL   Creatinine, Ser 1.11 0.76 - 1.27 mg/dL   GFR calc non Af Amer 76 >59 mL/min/1.73   GFR calc Af Amer 88 >59 mL/min/1.73   BUN/Creatinine Ratio 11 9 - 20   Sodium 143 134 - 144 mmol/L   Potassium 5.2 3.5 - 5.2 mmol/L   Chloride 103 96 - 106 mmol/L   CO2 28 20 - 29 mmol/L   Calcium 9.7 8.7 - 10.2 mg/dL  PSA  Result Value Ref Range   Prostate Specific Ag, Serum  1.0 0.0 - 4.0 ng/mL  TSH  Result Value Ref Range   TSH 3.700 0.450 - 4.500 uIU/mL        Review of Systems No headache, no major weight loss or weight gain, no chest pain no back pain abdominal pain no change in bowel habits complete ROS otherwise negative     Objective:   Physical Exam  Alert and oriented, vitals reviewed and stable, NAD ENT-TM's and ext canals WNL bilat via otoscopic exam Soft palate, tonsils and post pharynx WNL via oropharyngeal exam Neck-symmetric, no masses; thyroid nonpalpable and nontender Pulmonary-no tachypnea or accessory muscle use; Clear without wheezes via auscultation Card--no abnrml murmurs, rhythm reg and rate WNL Carotid pulses symmetric, without bruits       Assessment & Plan:  1 impression 1 right anterolateral chest pain.  Likely chest wall pain.  Patient concerned that it could be something more serious  2.  Left lumbar pain.  Likely nonrenal.  Likely musculoskeletal.  Patient quite concerned  that it could be something more serious.  3.  Intermittent right lateral abdominal pain.  Once again most likely diagnosis is muscular but not present certain.  We will press on with further blood work and ultrasounds of all involved areas and chest x-ray.  Rationale discussed.  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

## 2018-03-09 ENCOUNTER — Telehealth: Payer: Self-pay | Admitting: Family Medicine

## 2018-03-09 NOTE — Telephone Encounter (Signed)
Called to give pt ultrasound appointment information & he asked if x-ray results were back  Explained that yes the results are back & once doc has reviewed a nurse would call with result information or mail card  Patient requesting call once doc has reviewed results

## 2018-03-09 NOTE — Telephone Encounter (Signed)
Patient is aware 

## 2018-03-09 NOTE — Telephone Encounter (Signed)
Calling to check on results to chest x-ray.  If unable to reach at home, call mobile [phone].

## 2018-03-09 NOTE — Telephone Encounter (Signed)
See response in lab reports

## 2018-03-09 NOTE — Telephone Encounter (Signed)
Please advise as to what to advise this pt.

## 2018-03-15 ENCOUNTER — Ambulatory Visit (HOSPITAL_COMMUNITY)
Admission: RE | Admit: 2018-03-15 | Discharge: 2018-03-15 | Disposition: A | Discharge status: Home / Self Care | Payer: 59 | Source: Ambulatory Visit | Attending: Family Medicine | Admitting: Family Medicine

## 2018-03-15 ENCOUNTER — Telehealth: Payer: Self-pay | Admitting: Family Medicine

## 2018-03-15 DIAGNOSIS — R1011 Right upper quadrant pain: Secondary | ICD-10-CM | POA: Insufficient documentation

## 2018-03-15 DIAGNOSIS — Z9049 Acquired absence of other specified parts of digestive tract: Secondary | ICD-10-CM | POA: Diagnosis not present

## 2018-03-15 DIAGNOSIS — R109 Unspecified abdominal pain: Secondary | ICD-10-CM | POA: Diagnosis not present

## 2018-03-15 NOTE — Telephone Encounter (Signed)
Dr. Richardson Landry to see message

## 2018-03-15 NOTE — Telephone Encounter (Signed)
The ultrasound of the right upper quadrant shows normal biliary duct shows normal liver.,  The ultrasound of the kidney shows simple cysts in the right kidney which would not be causing his pain and are nonspecific/tend to be a very common finding not a sign of any serious issue.  I would recommend for the patient to do a follow-up visit with Dr. Richardson Landry next week notify us if any other issues going on in the meantime

## 2018-03-15 NOTE — Telephone Encounter (Signed)
Patient is calling to request ultrasound results from today.

## 2018-03-15 NOTE — Telephone Encounter (Signed)
Patient is aware 

## 2018-03-19 ENCOUNTER — Encounter: Payer: Self-pay | Admitting: Family Medicine

## 2018-03-20 ENCOUNTER — Ambulatory Visit (INDEPENDENT_AMBULATORY_CARE_PROVIDER_SITE_OTHER): Payer: 59 | Admitting: Family Medicine

## 2018-03-20 ENCOUNTER — Encounter: Payer: Self-pay | Admitting: Family Medicine

## 2018-03-20 VITALS — BP 122/70 | Temp 99.0°F | Ht 70.0 in | Wt 182.0 lb

## 2018-03-20 DIAGNOSIS — R109 Unspecified abdominal pain: Secondary | ICD-10-CM | POA: Diagnosis not present

## 2018-03-20 DIAGNOSIS — R079 Chest pain, unspecified: Secondary | ICD-10-CM

## 2018-03-20 DIAGNOSIS — F411 Generalized anxiety disorder: Secondary | ICD-10-CM | POA: Diagnosis not present

## 2018-03-20 MED ORDER — ESCITALOPRAM OXALATE 10 MG PO TABS: 10.0000 mg | ORAL_TABLET | ORAL | 2 refills | Status: DC

## 2018-03-20 MED ORDER — LORAZEPAM 0.5 MG PO TABS: ORAL_TABLET | ORAL | 0 refills | Status: DC

## 2018-03-20 NOTE — Telephone Encounter (Signed)
Patient coming in per office visit per Dr. Richardson Landry

## 2018-03-20 NOTE — Telephone Encounter (Signed)
Pt was seen today and dr Richardson Landry address this concern at visit

## 2018-03-20 NOTE — Progress Notes (Signed)
   Subjective:    Patient ID: John Wu, male    DOB: September 28, 1966, 52 y.o.   MRN: [id number]  HPIFollow up on abdominal pain and back pain.   Follow up on test results.   Pt sent this my chart message below over the weekend.  Due to increased concern and worry over recent pains I [facility]. One thought he had was possibly my thyroid might be too high and causing me some anxiety. Could we possibly order lab work early on Monday. April 8 and have available for you to review at our 1pm appt? If not, I understand.   Patient arrives with his wife for an enormously protracted discussion regarding his ongoing symptoms concerns worries patient having more more challenges with free-floating anxiety.  Worrying all the time.  Starting to affect his sleep.  Starting to affect his appetite.  Starting to affect his relationships at home.  Still having low back pain.  Lumbar in nature.  Occasional twinges.  Still having occasional right-sided chest pain. Tonsils no sudden nausea shortness of breath or dyspnea  Some element of reflux for which the patient's wife who is a pharmacist has initiated a proton pump inhibitor  Please see prior note.  Please see ultrasound results already reviewed with the patient.    Review of Systems No headache, no major weight loss or weight gain, no chest pain no back pain abdominal pain no change in bowel habits complete ROS otherwise negative     Objective:   Physical Exam  Alert and oriented, vitals reviewed and stable, NAD ENT-TM's and ext canals WNL bilat via otoscopic exam Soft palate, tonsils and post pharynx WNL via oropharyngeal exam Neck-symmetric, no masses; thyroid nonpalpable and nontender Pulmonary-no tachypnea or accessory muscle use; Clear without wheezes via auscultation Card--no abnrml murmurs, rhythm reg and rate WNL Carotid pulses symmetric, without bruits Patient very anxious during exam.        Assessment & Plan:  Impression generalized anxiety.1 likely patient's main problem.  Discussed at great length.  Using as needed Ativan.  Will add Lexapro.  Benefits side effects discussed.  Exacerbated by father's illness he has stage IV lung cancer  2.  Renal cyst.  Benign-appearing.  I feel needs no further workup rationale discussed  3.  Lumbar pain likely musculoskeletal  4.  Chest pain likely musculoskeletal  5.  3 pounds weight loss for this concerns the patient.  He does not concern me 3 pounds over 3 years is definitely within normal limits  Educational information given.  .40 Greater than 50% of this 40 minute face to face visit was spent in counseling and discussion and coordination of care regarding the above diagnosis/diagnosies  We will do blood work to rule out other concerns and assess hematological status

## 2018-03-20 NOTE — Patient Instructions (Signed)

## 2018-03-21 LAB — BASIC METABOLIC PANEL
BUN / CREAT RATIO: 18 (ref 9–20)
BUN: 20 mg/dL (ref 6–24)
CHLORIDE: 106 mmol/L (ref 96–106)
CO2: 25 mmol/L (ref 20–29)
Calcium: 9.1 mg/dL (ref 8.7–10.2)
Creatinine, Ser: 1.11 mg/dL (ref 0.76–1.27)
GFR calc non Af Amer: 75 mL/min/{1.73_m2} (ref >59)
GFR, EST AFRICAN AMERICAN: 87 mL/min/{1.73_m2} (ref >59)
GLUCOSE: 109 mg/dL — AB (ref 65–99)
POTASSIUM: 4.4 mmol/L (ref 3.5–5.2)
SODIUM: 146 mmol/L — AB (ref 134–144)

## 2018-03-21 LAB — CBC WITH DIFFERENTIAL/PLATELET
BASOS ABS: 0 10*3/uL (ref 0.0–0.2)
BASOS: 0 %
EOS (ABSOLUTE): 0.1 10*3/uL (ref 0.0–0.4)
Eos: 1 %
Hematocrit: 45.3 % (ref 37.5–51.0)
Hemoglobin: 15.1 g/dL (ref 13.0–17.7)
Immature Grans (Abs): 0 10*3/uL (ref 0.0–0.1)
Immature Granulocytes: 0 %
LYMPHS: 17 %
Lymphocytes Absolute: 1.4 10*3/uL (ref 0.7–3.1)
MCH: 29.6 pg (ref 26.6–33.0)
MCHC: 33.3 g/dL (ref 31.5–35.7)
MCV: 89 fL (ref 79–97)
MONOS ABS: 0.5 10*3/uL (ref 0.1–0.9)
Monocytes: 6 %
NEUTROS ABS: 6.2 10*3/uL (ref 1.4–7.0)
Neutrophils: 76 %
PLATELETS: 253 10*3/uL (ref 150–379)
RBC: 5.1 x10E6/uL (ref 4.14–5.80)
RDW: 13.7 % (ref 12.3–15.4)
WBC: 8.1 10*3/uL (ref 3.4–10.8)

## 2018-03-21 LAB — HEPATIC FUNCTION PANEL
ALT: 19 IU/L (ref 0–44)
AST: 17 IU/L (ref 0–40)
Albumin: 4.3 g/dL (ref 3.5–5.5)
Alkaline Phosphatase: 77 IU/L (ref 39–117)
Bilirubin Total: 0.3 mg/dL (ref 0.0–1.2)
Bilirubin, Direct: 0.11 mg/dL (ref 0.00–0.40)
TOTAL PROTEIN: 6.1 g/dL (ref 6.0–8.5)

## 2018-03-21 LAB — LIPASE: LIPASE: 38 U/L (ref 13–78)

## 2018-03-21 LAB — AMYLASE: AMYLASE: 59 U/L (ref 31–124)

## 2018-03-21 LAB — TSH: TSH: 2.38 u[IU]/mL (ref 0.450–4.500)

## 2018-03-21 MED FILL — LORazepam 0.5 MG TABS: 0.5 | 24 days supply | Qty: 24 | Fill #0

## 2018-05-01 ENCOUNTER — Ambulatory Visit (INDEPENDENT_AMBULATORY_CARE_PROVIDER_SITE_OTHER): Payer: 59 | Admitting: Family Medicine

## 2018-05-01 ENCOUNTER — Encounter: Payer: Self-pay | Admitting: Family Medicine

## 2018-05-01 VITALS — BP 128/82 | Ht 70.0 in | Wt 188.6 lb

## 2018-05-01 DIAGNOSIS — R109 Unspecified abdominal pain: Secondary | ICD-10-CM | POA: Diagnosis not present

## 2018-05-01 DIAGNOSIS — F411 Generalized anxiety disorder: Secondary | ICD-10-CM | POA: Diagnosis not present

## 2018-05-01 NOTE — Patient Instructions (Signed)
Change nexium gen to every other day for couple three weeks, if tolerate, back off to every third same time frame, if tolerate, use as needed    An alternative for mild to mod recurring aggraviting reflux is to take a daily dose of ranitidin 150  To 300 mg once daily, which longerm, if you have to use daily, is preferable

## 2018-05-01 NOTE — Progress Notes (Signed)
   Subjective:    Patient ID: John Wu, male    DOB: 11-02-1966, 52 y.o.   MRN: [id number]  HPI  Patient arrives for a follow up on abdominal pain. Patient stopped Lexapro 5 weeks ago because he didn't like how it made him feel.  Felt real groggy off the meds   Felt foggy also ,  Almost sig change in vision    Appetite better  Random pain off and on  Using nexium, daily, has no had uppdr abd symtoms since tking, and nos no longer any burning, hs reumed spicy food s without sig difficulty  Exercise regimen firly good, has cut back on excesive exrcise     No major hx of reflux issues, did have g b dysfunction with subseuent surgery     Review of Systems No headache, no major weight loss or weight gain, no chest pain no back pain abdominal pain no change in bowel habits complete ROS otherwise negative     Objective:   Physical Exam  Alert vitals stable, NAD. Blood pressure good on repeat. HEENT normal. Lungs clear. Heart regular rate and rhythm.       Assessment & Plan:  1 testing generalized anxiety disorder.  Clinically improved.  Patient came off Lexapro because of side effects.  Feels he needs no chronic medicine at this time notes overall significant improvement  2.  Reflux clinically improved.  Patient will try to wean down off his proton pump rationale discussed

## 2018-06-14 ENCOUNTER — Ambulatory Visit (INDEPENDENT_AMBULATORY_CARE_PROVIDER_SITE_OTHER): Payer: 59 | Admitting: Family Medicine

## 2018-06-14 ENCOUNTER — Encounter: Payer: Self-pay | Admitting: Family Medicine

## 2018-06-14 VITALS — BP 124/72 | Temp 99.0°F | Ht 70.0 in | Wt 183.0 lb

## 2018-06-14 DIAGNOSIS — R21 Rash and other nonspecific skin eruption: Secondary | ICD-10-CM | POA: Diagnosis not present

## 2018-06-14 NOTE — Progress Notes (Signed)
   Subjective:    Patient ID: John Wu, male    DOB: [birth year], 52 y.o.   MRN: [id number]  HPIPossible spider bite on buttock. Noticed it about one week ago.    deloped a pianful area on the right post buttock  Recalls no injury  Wondered if it was a bite  Feels sens and sore  Pt has hx of sting two weeks ago       Review of Systems No headache, no major weight loss or weight gain, no chest pain no back pain abdominal pain no change in bowel habits complete ROS otherwise negative     Objective:   Physical Exam  Alert vitals stable, NAD. Blood pressure good on repeat. HEENT normal. Lungs clear. Heart regular rate and rhythm.  Distinct oval rash with raised edges central clearing and small central papule noted on right upper buttock     Assessment & Plan:  Impression nonvenomous bite rationale discussed at length with patient.  Multiple questions answered.  No indication for antibiotics at this time.  Rationale discussed  Greater than 50% of this 15 minute face to face visit was spent in counseling and discussion and coordination of care regarding the above diagnosis/diagnosies

## 2018-06-14 NOTE — Patient Instructions (Signed)
This is a non venomous bit by a insect or sider  Chance of lyme disease looking like this virtually zero  Chance of bacterial infxn in the skin looking and feeling like this also virtually zero  Happy to re asseess rash at any point  hafd for me to advise signs when in my heart I dont think they're coming  So   If fever chills enlrging rash drainging area

## 2018-06-26 DIAGNOSIS — S9 Superficial injury of ankle, foot and toes: Secondary | ICD-10-CM | POA: Diagnosis not present

## 2018-06-30 ENCOUNTER — Ambulatory Visit (INDEPENDENT_AMBULATORY_CARE_PROVIDER_SITE_OTHER): Payer: 59 | Admitting: Nurse Practitioner

## 2018-06-30 ENCOUNTER — Encounter: Payer: Self-pay | Admitting: Nurse Practitioner

## 2018-06-30 VITALS — BP 132/82 | Temp 98.5°F | Ht 70.0 in | Wt 189.0 lb

## 2018-06-30 DIAGNOSIS — M79671 Pain in right foot: Secondary | ICD-10-CM | POA: Diagnosis not present

## 2018-06-30 DIAGNOSIS — S91114D Laceration without foreign body of right lesser toe(s) without damage to nail, subsequent encounter: Secondary | ICD-10-CM | POA: Diagnosis not present

## 2018-06-30 DIAGNOSIS — Z23 Encounter for immunization: Secondary | ICD-10-CM

## 2018-06-30 NOTE — Patient Instructions (Signed)
DASH Eating Plan DASH stands for "Dietary Approaches to Stop Hypertension." The DASH eating plan is a healthy eating plan that has been shown to reduce high blood pressure (hypertension). It may also reduce your risk for type 2 diabetes, heart disease, and stroke. The DASH eating plan may also help with weight loss. What are tips for following this plan? General guidelines  Avoid eating more than 2,300 mg (milligrams) of salt (sodium) a day. If you have hypertension, you may need to reduce your sodium intake to 1,500 mg a day.  Limit alcohol intake to no more than 1 drink a day for nonpregnant women and 2 drinks a day for men. One drink equals 12 oz of beer, 5 oz of wine, or 1 oz of hard liquor.  Work with your health care provider to maintain a healthy body weight or to lose weight. Ask what an ideal weight is for you.  Get at least 30 minutes of exercise that causes your heart to beat faster (aerobic exercise) most days of the week. Activities may include walking, swimming, or biking.  Work with your health care provider or diet and nutrition specialist (dietitian) to adjust your eating plan to your individual calorie needs. Reading food labels  Check food labels for the amount of sodium per serving. Choose foods with less than 5 percent of the Daily Value of sodium. Generally, foods with less than 300 mg of sodium per serving fit into this eating plan.  To find whole grains, look for the word "whole" as the first word in the ingredient list. Shopping  Buy products labeled as "low-sodium" or "no salt added."  Buy fresh foods. Avoid canned foods and premade or frozen meals. Cooking  Avoid adding salt when cooking. Use salt-free seasonings or herbs instead of table salt or sea salt. Check with your health care provider or pharmacist before using salt substitutes.  Do not fry foods. Cook foods using healthy methods such as baking, boiling, grilling, and broiling instead.  Cook with  heart-healthy oils, such as olive, canola, soybean, or sunflower oil. Meal planning   Eat a balanced diet that includes: ? 5 or more servings of fruits and vegetables each day. At each meal, try to fill half of your plate with fruits and vegetables. ? Up to 6-8 servings of whole grains each day. ? Less than 6 oz of lean meat, poultry, or fish each day. A 3-oz serving of meat is about the same size as a deck of cards. One egg equals 1 oz. ? 2 servings of low-fat dairy each day. ? A serving of nuts, seeds, or beans 5 times each week. ? Heart-healthy fats. Healthy fats called Omega-3 fatty acids are found in foods such as flaxseeds and coldwater fish, like sardines, salmon, and mackerel.  Limit how much you eat of the following: ? Canned or prepackaged foods. ? Food that is high in trans fat, such as fried foods. ? Food that is high in saturated fat, such as fatty meat. ? Sweets, desserts, sugary drinks, and other foods with added sugar. ? Full-fat dairy products.  Do not salt foods before eating.  Try to eat at least 2 vegetarian meals each week.  Eat more home-cooked food and less restaurant, buffet, and fast food.  When eating at a restaurant, ask that your food be prepared with less salt or no salt, if possible. What foods are recommended? The items listed may not be a complete list. Talk with your dietitian about what   dietary choices are best for you. Grains Whole-grain or whole-wheat bread. Whole-grain or whole-wheat pasta. Brown rice. Oatmeal. Quinoa. Bulgur. Whole-grain and low-sodium cereals. Pita bread. Low-fat, low-sodium crackers. Whole-wheat flour tortillas. Vegetables Fresh or frozen vegetables (raw, steamed, roasted, or grilled). Low-sodium or reduced-sodium tomato and vegetable juice. Low-sodium or reduced-sodium tomato sauce and tomato paste. Low-sodium or reduced-sodium canned vegetables. Fruits All fresh, dried, or frozen fruit. Canned fruit in natural juice (without  added sugar). Meat and other protein foods Skinless chicken or turkey. Ground chicken or turkey. Pork with fat trimmed off. Fish and seafood. Egg whites. Dried beans, peas, or lentils. Unsalted nuts, nut butters, and seeds. Unsalted canned beans. Lean cuts of beef with fat trimmed off. Low-sodium, lean deli meat. Dairy Low-fat (1%) or fat-free (skim) milk. Fat-free, low-fat, or reduced-fat cheeses. Nonfat, low-sodium ricotta or cottage cheese. Low-fat or nonfat yogurt. Low-fat, low-sodium cheese. Fats and oils Soft margarine without trans fats. Vegetable oil. Low-fat, reduced-fat, or light mayonnaise and salad dressings (reduced-sodium). Canola, safflower, olive, soybean, and sunflower oils. Avocado. Seasoning and other foods Herbs. Spices. Seasoning mixes without salt. Unsalted popcorn and pretzels. Fat-free sweets. What foods are not recommended? The items listed may not be a complete list. Talk with your dietitian about what dietary choices are best for you. Grains Baked goods made with fat, such as croissants, muffins, or some breads. Dry pasta or rice meal packs. Vegetables Creamed or fried vegetables. Vegetables in a cheese sauce. Regular canned vegetables (not low-sodium or reduced-sodium). Regular canned tomato sauce and paste (not low-sodium or reduced-sodium). Regular tomato and vegetable juice (not low-sodium or reduced-sodium). Pickles. Olives. Fruits Canned fruit in a light or heavy syrup. Fried fruit. Fruit in cream or butter sauce. Meat and other protein foods Fatty cuts of meat. Ribs. Fried meat. Bacon. Sausage. Bologna and other processed lunch meats. Salami. Fatback. Hotdogs. Bratwurst. Salted nuts and seeds. Canned beans with added salt. Canned or smoked fish. Whole eggs or egg yolks. Chicken or turkey with skin. Dairy Whole or 2% milk, cream, and half-and-half. Whole or full-fat cream cheese. Whole-fat or sweetened yogurt. Full-fat cheese. Nondairy creamers. Whipped toppings.  Processed cheese and cheese spreads. Fats and oils Butter. Stick margarine. Lard. Shortening. Ghee. Bacon fat. Tropical oils, such as coconut, palm kernel, or palm oil. Seasoning and other foods Salted popcorn and pretzels. Onion salt, garlic salt, seasoned salt, table salt, and sea salt. Worcestershire sauce. Tartar sauce. Barbecue sauce. Teriyaki sauce. Soy sauce, including reduced-sodium. Steak sauce. Canned and packaged gravies. Fish sauce. Oyster sauce. Cocktail sauce. Horseradish that you find on the shelf. Ketchup. Mustard. Meat flavorings and tenderizers. Bouillon cubes. Hot sauce and Tabasco sauce. Premade or packaged marinades. Premade or packaged taco seasonings. Relishes. Regular salad dressings. Where to find more information:  National Heart, Lung, and Blood Institute: www.nhlbi.nih.gov  American Heart Association: www.heart.org Summary  The DASH eating plan is a healthy eating plan that has been shown to reduce high blood pressure (hypertension). It may also reduce your risk for type 2 diabetes, heart disease, and stroke.  With the DASH eating plan, you should limit salt (sodium) intake to 2,300 mg a day. If you have hypertension, you may need to reduce your sodium intake to 1,500 mg a day.  When on the DASH eating plan, aim to eat more fresh fruits and vegetables, whole grains, lean proteins, low-fat dairy, and heart-healthy fats.  Work with your health care provider or diet and nutrition specialist (dietitian) to adjust your eating plan to your individual   calorie needs. This information is not intended to replace advice given to you by your health care provider. Make sure you discuss any questions you have with your health care provider. Document Released: 11/18/2011 Document Revised: 11/22/2016 Document Reviewed: 11/22/2016 Elsevier Interactive Patient Education  2018 [employer]  

## 2018-07-01 ENCOUNTER — Encounter: Payer: Self-pay | Admitting: Nurse Practitioner

## 2018-07-01 NOTE — Progress Notes (Signed)
Subjective:  Presents requesting tetanus vaccine due to recent laceration. Patient was on a cruise when he hit his toe on a cement object causing a laceration on the bottom of his right small toe. Sutures were placed on 7/15.   Objective:   BP 132/82   Temp 98.5 F (36.9 C) (Oral)   Ht 5\' 10"  (1.778 m)   Wt 189 lb (85.7 kg)   BMI 27.12 kg/m  NAD. Alert, oriented. Sutures noted at the base of the fifth toe right foot. Healing well. No evidence of infection.   Assessment:  Laceration of fifth toe of right foot, subsequent encounter - Plan: Tdap vaccine greater than or equal to 7yo IM    Plan:  Tdap today. Return early next week for suture removal. Reviewed wound care and signs of infection. Call back if any problems.

## 2018-07-04 ENCOUNTER — Ambulatory Visit (INDEPENDENT_AMBULATORY_CARE_PROVIDER_SITE_OTHER): Payer: 59 | Admitting: Family Medicine

## 2018-07-04 ENCOUNTER — Encounter: Payer: Self-pay | Admitting: Family Medicine

## 2018-07-04 VITALS — BP 122/84 | Temp 98.0°F | Ht 70.0 in | Wt 185.6 lb

## 2018-07-04 DIAGNOSIS — Z4802 Encounter for removal of sutures: Secondary | ICD-10-CM

## 2018-07-04 NOTE — Progress Notes (Signed)
   Subjective:    Patient ID: John Wu, male    DOB: 1966-01-20, 52 y.o.   MRN: [id number]  HPI Pt here today to have stitches removed from right foot. Pt seen 06/30/2018   Review of Systems No headache, no major weight loss or weight gain, no chest pain no back pain abdominal pain no change in bowel habits complete ROS otherwise negative     Objective:   Physical Exam  Alert vitals stable, NAD. Blood pressure good on repeat. HEENT normal. Lungs clear. Heart regular rate and rhythm.  Wound healing well sutures removed wound care discussed      Assessment & Plan:

## 2018-09-04 ENCOUNTER — Other Ambulatory Visit: Payer: Self-pay | Admitting: Family Medicine

## 2018-09-04 ENCOUNTER — Telehealth: Payer: Self-pay | Admitting: Family Medicine

## 2018-09-04 ENCOUNTER — Other Ambulatory Visit: Payer: Self-pay

## 2018-09-04 DIAGNOSIS — Z13220 Encounter for screening for lipoid disorders: Secondary | ICD-10-CM

## 2018-09-04 DIAGNOSIS — Z125 Encounter for screening for malignant neoplasm of prostate: Secondary | ICD-10-CM

## 2018-09-04 DIAGNOSIS — E039 Hypothyroidism, unspecified: Secondary | ICD-10-CM

## 2018-09-04 DIAGNOSIS — Z79899 Other long term (current) drug therapy: Secondary | ICD-10-CM

## 2018-09-04 DIAGNOSIS — Z0000 Encounter for general adult medical examination without abnormal findings: Secondary | ICD-10-CM

## 2018-09-04 NOTE — Telephone Encounter (Signed)
Patient is aware the lab orders have been put in epic and refills on thyroid med sent to drug store.

## 2018-09-04 NOTE — Telephone Encounter (Signed)
Has PE here 10/11/18 - will he need lab work  Pt's mail order pharmacy has requested a refill on his thyroid medicine, can we go ahead and refill for 90 day or a 30 day local until can be seen for physcial  Please advise & call pt when done

## 2018-09-04 NOTE — Telephone Encounter (Signed)
Last labs 03/2018: TSH, Liver, Met 7 and CBC                 08/2017: Lipid and PSA

## 2018-09-04 NOTE — Telephone Encounter (Signed)
Lip liv m7 tsh psa

## 2018-10-04 DIAGNOSIS — E039 Hypothyroidism, unspecified: Secondary | ICD-10-CM | POA: Diagnosis not present

## 2018-10-04 DIAGNOSIS — Z125 Encounter for screening for malignant neoplasm of prostate: Secondary | ICD-10-CM | POA: Diagnosis not present

## 2018-10-04 DIAGNOSIS — Z0000 Encounter for general adult medical examination without abnormal findings: Secondary | ICD-10-CM | POA: Diagnosis not present

## 2018-10-04 DIAGNOSIS — Z79899 Other long term (current) drug therapy: Secondary | ICD-10-CM | POA: Diagnosis not present

## 2018-10-05 LAB — BASIC METABOLIC PANEL
BUN/Creatinine Ratio: 16 (ref 9–20)
BUN: 17 mg/dL (ref 6–24)
CALCIUM: 9.7 mg/dL (ref 8.7–10.2)
CHLORIDE: 103 mmol/L (ref 96–106)
CO2: 26 mmol/L (ref 20–29)
Creatinine, Ser: 1.08 mg/dL (ref 0.76–1.27)
GFR calc non Af Amer: 78 mL/min/{1.73_m2} (ref >59)
GFR, EST AFRICAN AMERICAN: 90 mL/min/{1.73_m2} (ref >59)
GLUCOSE: 97 mg/dL (ref 65–99)
POTASSIUM: 4.6 mmol/L (ref 3.5–5.2)
Sodium: 142 mmol/L (ref 134–144)

## 2018-10-05 LAB — HEPATIC FUNCTION PANEL
ALBUMIN: 4.4 g/dL (ref 3.5–5.5)
ALT: 22 IU/L (ref 0–44)
AST: 16 IU/L (ref 0–40)
Alkaline Phosphatase: 72 IU/L (ref 39–117)
Bilirubin Total: 0.6 mg/dL (ref 0.0–1.2)
Bilirubin, Direct: 0.15 mg/dL (ref 0.00–0.40)
TOTAL PROTEIN: 6.2 g/dL (ref 6.0–8.5)

## 2018-10-05 LAB — LIPID PANEL
Chol/HDL Ratio: 5 ratio (ref 0.0–5.0)
Cholesterol, Total: 195 mg/dL (ref 100–199)
HDL: 39 mg/dL — AB (ref >39)
LDL CALC: 123 mg/dL — AB (ref 0–99)
Triglycerides: 167 mg/dL — ABNORMAL HIGH (ref 0–149)
VLDL CHOLESTEROL CAL: 33 mg/dL (ref 5–40)

## 2018-10-05 LAB — TSH: TSH: 2.01 u[IU]/mL (ref 0.450–4.500)

## 2018-10-05 LAB — PSA: PROSTATE SPECIFIC AG, SERUM: 0.9 ng/mL (ref 0.0–4.0)

## 2018-10-11 ENCOUNTER — Encounter: Payer: Self-pay | Admitting: Family Medicine

## 2018-10-11 ENCOUNTER — Ambulatory Visit (INDEPENDENT_AMBULATORY_CARE_PROVIDER_SITE_OTHER): Payer: 59 | Admitting: Family Medicine

## 2018-10-11 VITALS — BP 120/80 | Ht 70.0 in | Wt 185.1 lb

## 2018-10-11 DIAGNOSIS — Z0000 Encounter for general adult medical examination without abnormal findings: Secondary | ICD-10-CM

## 2018-10-11 DIAGNOSIS — E039 Hypothyroidism, unspecified: Secondary | ICD-10-CM | POA: Diagnosis not present

## 2018-10-11 MED ORDER — LEVOTHYROXINE SODIUM 112 MCG PO TABS: 112.0000 ug | ORAL_TABLET | Freq: Every day | ORAL | 1 refills | Status: DC

## 2018-10-11 NOTE — Progress Notes (Signed)
Subjective:    Patient ID: John Wu, male    DOB: 1966/11/28, 52 y.o.   MRN: [id number]  HPI The patient comes in today for a wellness visit.    A review of their health history was completed.  A review of medications was also completed.  Any needed refills; No  Eating habits: Good  Falls/  MVA accidents in past few months: No  Regular exercise: Yes two miles + per day.  Specialist pt sees on regular basis: No  Preventative health issues were discussed.   Additional concerns: No  Results for orders placed or performed in visit on 09/04/18  TSH  Result Value Ref Range   TSH 2.010 0.450 - 4.500 uIU/mL  PSA  Result Value Ref Range   Prostate Specific Ag, Serum 0.9 0.0 - 4.0 ng/mL  Basic Metabolic Panel (BMET)  Result Value Ref Range   Glucose 97 65 - 99 mg/dL   BUN 17 6 - 24 mg/dL   Creatinine, Ser 1.08 0.76 - 1.27 mg/dL   GFR calc non Af Amer 78 >59 mL/min/1.73   GFR calc Af Amer 90 >59 mL/min/1.73   BUN/Creatinine Ratio 16 9 - 20   Sodium 142 134 - 144 mmol/L   Potassium 4.6 3.5 - 5.2 mmol/L   Chloride 103 96 - 106 mmol/L   CO2 26 20 - 29 mmol/L   Calcium 9.7 8.7 - 10.2 mg/dL  Hepatic function panel  Result Value Ref Range   Total Protein 6.2 6.0 - 8.5 g/dL   Albumin 4.4 3.5 - 5.5 g/dL   Bilirubin Total 0.6 0.0 - 1.2 mg/dL   Bilirubin, Direct 0.15 0.00 - 0.40 mg/dL   Alkaline Phosphatase 72 39 - 117 IU/L   AST 16 0 - 40 IU/L   ALT 22 0 - 44 IU/L  Lipid Profile  Result Value Ref Range   Cholesterol, Total 195 100 - 199 mg/dL   Triglycerides 167 (H) 0 - 149 mg/dL   HDL 39 (L) >39 mg/dL   VLDL Cholesterol Cal 33 5 - 40 mg/dL   LDL Calculated 123 (H) 0 - 99 mg/dL   Chol/HDL Ratio 5.0 0.0 - 5.0 ratio   Stomach symtoms have calmed down , no discomfort      Review of Systems  Constitutional: Negative for activity change, appetite change and fever.  HENT: Negative for congestion and rhinorrhea.   Eyes: Negative for discharge.  Respiratory:  Negative for cough and wheezing.   Cardiovascular: Negative for chest pain.  Gastrointestinal: Negative for abdominal pain, blood in stool and vomiting.  Genitourinary: Negative for difficulty urinating and frequency.  Musculoskeletal: Negative for neck pain.  Skin: Negative for rash.  Allergic/Immunologic: Negative for environmental allergies and food allergies.  Neurological: Negative for weakness and headaches.  Psychiatric/Behavioral: Negative for agitation.  All other systems reviewed and are negative.      Objective:   Physical Exam  Constitutional: He appears well-developed and well-nourished.  HENT:  Head: Normocephalic and atraumatic.  Right Ear: External ear normal.  Left Ear: External ear normal.  Nose: Nose normal.  Mouth/Throat: Oropharynx is clear and moist.  Eyes: Pupils are equal, round, and reactive to light. EOM are normal.  Neck: Normal range of motion. Neck supple. No thyromegaly present.  Cardiovascular: Normal rate, regular rhythm and normal heart sounds.  No murmur heard. Pulmonary/Chest: Effort normal and breath sounds normal. No respiratory distress. He has no wheezes.  Abdominal: Soft. Bowel sounds are normal. He exhibits no  distension and no mass. There is no tenderness.  Genitourinary: Prostate normal and penis normal.  Musculoskeletal: Normal range of motion. He exhibits no edema.  Lymphadenopathy:    He has no cervical adenopathy.  Neurological: He is alert. He exhibits normal muscle tone.  Skin: Skin is warm and dry. No erythema.  Psychiatric: He has a normal mood and affect. His behavior is normal. Judgment normal.  Vitals reviewed.         Assessment & Plan:  Impression wellness exam.  Diet discussed.  Exercise discussed.  Vaccines discussed and administered were appropriate.  Colonoscopy discussed.  2.  Generalized anxiety disorder clinically stable per patient at this time.  3.  Hypothyroidism.  TSH.  Recommend same dose of thyroid  medicine rationale discussed

## 2018-10-11 NOTE — Patient Instructions (Signed)

## 2019-01-04 ENCOUNTER — Ambulatory Visit (INDEPENDENT_AMBULATORY_CARE_PROVIDER_SITE_OTHER): Payer: 59

## 2019-01-04 ENCOUNTER — Ambulatory Visit (INDEPENDENT_AMBULATORY_CARE_PROVIDER_SITE_OTHER): Payer: 59 | Admitting: Orthopedic Surgery

## 2019-01-04 VITALS — BP 146/83 | HR 75 | Ht 70.0 in | Wt 182.0 lb

## 2019-01-04 DIAGNOSIS — S2231XA Fracture of one rib, right side, initial encounter for closed fracture: Secondary | ICD-10-CM

## 2019-01-04 DIAGNOSIS — R0781 Pleurodynia: Secondary | ICD-10-CM | POA: Diagnosis not present

## 2019-01-04 NOTE — Patient Instructions (Addendum)
Symptomatic treatment Ibuprofen  Deep breathing  If you become short of breath or cough up blood, please call the doctor.    Rib Fracture  A rib fracture is a break or crack in one of the bones of the ribs. The ribs are like a cage that goes around your upper chest. A broken or cracked rib is often painful, but most do not cause other problems. Most rib fractures usually heal on their own in 1-3 months. Follow these instructions at home: Managing pain, stiffness, and swelling  If directed, apply ice to the injured area. ? Put ice in a plastic bag. ? Place a towel between your skin and the bag. ? Leave the ice on for 20 minutes, 2-3 times a day.  Take over-the-counter and prescription medicines only as told by your doctor. Activity  Avoid activities that cause pain to the injured area. Protect your injured area.  Slowly increase activity as told by your doctor. General instructions  Do deep breathing as told by your doctor. You may be told to: ? Take deep breaths many times a day. ? Cough many times a day while hugging a pillow. ? Use a device (incentive spirometer) to do deep breathing many times a day.  Drink enough fluid to keep your pee (urine) clear or pale yellow.  Do not wear a rib belt or binder. These do not allow you to breathe deeply.  Keep all follow-up visits as told by your doctor. This is important. Contact a doctor if:  You have a fever. Get help right away if:  You have trouble breathing.  You are short of breath.  You cannot stop coughing.  You cough up thick or bloody spit (sputum).  You feel sick to your stomach (nauseous), throw up (vomit), or have belly (abdominal) pain.  Your pain gets worse and medicine does not help. Summary  A rib fracture is a break or crack in one of the bones of the ribs.  Apply ice to the injured area and take medicines for pain as told by your doctor.  Take deep breaths and cough many times a day. Hug a pillow  every time you cough. This information is not intended to replace advice given to you by your health care provider. Make sure you discuss any questions you have with your health care provider. Document Released: 09/07/2008 Document Revised: 03/01/2017 Document Reviewed: 03/01/2017 Elsevier Interactive Patient Education  2019 [employer].

## 2019-01-04 NOTE — Progress Notes (Signed)
NEW PROBLEM OFFICE VISIT  Chief Complaint  Patient presents with  . Back Pain    Back pain, DOI 01-02-19.    52 fell off of a step stool landed on right side onto a metal chair   doi 31 Dec 2018 Location right rib cage  Quality dull ache Severity mild Worse with clearing throat     Review of Systems  Respiratory: Negative for cough, sputum production, shortness of breath and wheezing.   Cardiovascular: Negative for chest pain, orthopnea, claudication and leg swelling.  Musculoskeletal: Negative for back pain and neck pain.  Neurological: Negative for tingling.     Past Medical History:  Diagnosis Date  . Hypothyroidism   . IBS (irritable bowel syndrome)     Past Surgical History:  Procedure Laterality Date  . CHOLECYSTECTOMY  2007  . VASECTOMY  2000    Family History  Problem Relation Age of Onset  . Hypertension Father   . Heart attack Father   . Colon cancer Neg Hx    Social History   Tobacco Use  . Smoking status: Never Smoker  . Smokeless tobacco: Never Used  Substance Use Topics  . Alcohol use: No    Frequency: Never  . Drug use: No    Allergies  Allergen Reactions  . Lexapro [Escitalopram Oxalate] Other (See Comments)    grogginess    No outpatient medications have been marked as taking for the 01/04/19 encounter (Office Visit) with Carole Civil, MD.    BP (!) 146/83   Pulse 75   Ht 5\' 10"  (1.778 m)   Wt 182 lb (82.6 kg)   BMI 26.11 kg/m   Physical Exam Constitutional:      General: He is not in acute distress.    Appearance: Normal appearance. He is well-developed and normal weight.  HENT:     Head: Normocephalic and atraumatic.     Right Ear: External ear normal.     Left Ear: External ear normal.     Mouth/Throat:     Pharynx: Oropharynx is clear.  Eyes:     General: No scleral icterus.    Extraocular Movements: Extraocular movements intact.     Pupils: Pupils are equal, round, and reactive to light.  Neck:   Musculoskeletal: Normal range of motion and neck supple. No neck rigidity or muscular tenderness.  Cardiovascular:     Rate and Rhythm: Normal rate and regular rhythm.     Pulses: Normal pulses.  Pulmonary:     Effort: Pulmonary effort is normal. No respiratory distress.     Breath sounds: No stridor. No wheezing or rales.  Chest:     Chest wall: Tenderness present.  Abdominal:     General: There is no distension.     Tenderness: There is no abdominal tenderness.     Comments: Non tender pelvis and stable   Musculoskeletal: Normal range of motion.        General: No swelling, tenderness or deformity.       Arms:     Right lower leg: No edema.     Left lower leg: No edema.  Lymphadenopathy:     Cervical: No cervical adenopathy.  Skin:    General: Skin is warm and dry.     Capillary Refill: Capillary refill takes less than 2 seconds.  Neurological:     Mental Status: He is alert and oriented to person, place, and time.  Psychiatric:        Behavior: Behavior normal.  Thought Content: Thought content normal.        Judgment: Judgment normal.     Ortho Exam  See above   MEDICAL DECISION SECTION  Xrays were done at [facility]   My independent reading of xrays:  Rib series was positive for hairline fracture Number 5 or 6   Encounter Diagnosis  Name Primary?  . Closed fracture of one rib of right side, initial encounter//probbably # 5  Yes    PLAN: (Rx., injectx, surgery, frx, mri/ct) Symptomatic treatment Appropriate precautions given  Fu prn   No orders of the defined types were placed in this encounter.   Arther Abbott, MD  01/04/2019 10:59 AM

## 2019-01-15 DIAGNOSIS — L570 Actinic keratosis: Secondary | ICD-10-CM | POA: Diagnosis not present

## 2019-01-16 ENCOUNTER — Other Ambulatory Visit: Payer: Self-pay | Admitting: Family Medicine

## 2019-05-03 DIAGNOSIS — L570 Actinic keratosis: Secondary | ICD-10-CM | POA: Diagnosis not present

## 2019-07-06 ENCOUNTER — Other Ambulatory Visit: Payer: Self-pay | Admitting: Family Medicine

## 2019-09-29 ENCOUNTER — Other Ambulatory Visit: Payer: Self-pay | Admitting: Family Medicine

## 2019-10-02 NOTE — Telephone Encounter (Signed)
sched either virt o v plus all same labs as last yr or, face to face3 wellnesss plus chronic, all same labs as last yr, then may ref times one

## 2019-10-02 NOTE — Telephone Encounter (Signed)
Please contact pt and set up appt; then route back to nurses.   Last labs lipid, hepatic, bmet, psa and tsh. We will place lab orders and sent in med when pt schedules appt.  Thank you!

## 2019-10-05 ENCOUNTER — Telehealth: Payer: Self-pay | Admitting: Family Medicine

## 2019-10-05 DIAGNOSIS — Z79899 Other long term (current) drug therapy: Secondary | ICD-10-CM

## 2019-10-05 DIAGNOSIS — E039 Hypothyroidism, unspecified: Secondary | ICD-10-CM

## 2019-10-05 DIAGNOSIS — Z125 Encounter for screening for malignant neoplasm of prostate: Secondary | ICD-10-CM

## 2019-10-05 DIAGNOSIS — Z13220 Encounter for screening for lipoid disorders: Secondary | ICD-10-CM

## 2019-10-05 NOTE — Telephone Encounter (Signed)
Last labs 10/04/18 lipid, liver, bmp, psa, tsh

## 2019-10-05 NOTE — Telephone Encounter (Signed)
Patient physical 11/3 and needing labs done

## 2019-10-05 NOTE — Telephone Encounter (Signed)
Patient schedule physical appointment but not needing refills right now . He states has enough medication

## 2019-10-07 NOTE — Telephone Encounter (Signed)
Same

## 2019-10-08 NOTE — Telephone Encounter (Signed)
Patient notified and verbalized understanding. 

## 2019-10-08 NOTE — Telephone Encounter (Signed)
Blood work ordered in Epic. Left message to return call 

## 2019-10-11 LAB — BASIC METABOLIC PANEL
BUN/Creatinine Ratio: 18 (ref 9–20)
BUN: 19 mg/dL (ref 6–24)
CO2: 25 mmol/L (ref 20–29)
Calcium: 9.6 mg/dL (ref 8.7–10.2)
Chloride: 105 mmol/L (ref 96–106)
Creatinine, Ser: 1.05 mg/dL (ref 0.76–1.27)
GFR calc Af Amer: 93 mL/min/{1.73_m2} (ref >59)
GFR calc non Af Amer: 80 mL/min/{1.73_m2} (ref >59)
Glucose: 102 mg/dL — ABNORMAL HIGH (ref 65–99)
Potassium: 4.1 mmol/L (ref 3.5–5.2)
Sodium: 142 mmol/L (ref 134–144)

## 2019-10-11 LAB — HEPATIC FUNCTION PANEL
ALT: 26 IU/L (ref 0–44)
AST: 21 IU/L (ref 0–40)
Albumin: 4.2 g/dL (ref 3.8–4.9)
Alkaline Phosphatase: 79 IU/L (ref 39–117)
Bilirubin Total: 0.6 mg/dL (ref 0.0–1.2)
Bilirubin, Direct: 0.14 mg/dL (ref 0.00–0.40)
Total Protein: 6 g/dL (ref 6.0–8.5)

## 2019-10-11 LAB — LIPID PANEL
Chol/HDL Ratio: 5.1 ratio — ABNORMAL HIGH (ref 0.0–5.0)
Cholesterol, Total: 185 mg/dL (ref 100–199)
HDL: 36 mg/dL — ABNORMAL LOW (ref >39)
LDL Chol Calc (NIH): 118 mg/dL — ABNORMAL HIGH (ref 0–99)
Triglycerides: 177 mg/dL — ABNORMAL HIGH (ref 0–149)
VLDL Cholesterol Cal: 31 mg/dL (ref 5–40)

## 2019-10-11 LAB — PSA: Prostate Specific Ag, Serum: 1 ng/mL (ref 0.0–4.0)

## 2019-10-11 LAB — TSH: TSH: 3.75 u[IU]/mL (ref 0.450–4.500)

## 2019-10-15 ENCOUNTER — Other Ambulatory Visit: Payer: Self-pay | Admitting: Family Medicine

## 2019-10-16 ENCOUNTER — Other Ambulatory Visit: Payer: Self-pay

## 2019-10-16 ENCOUNTER — Encounter: Payer: Self-pay | Admitting: Family Medicine

## 2019-10-16 ENCOUNTER — Ambulatory Visit (INDEPENDENT_AMBULATORY_CARE_PROVIDER_SITE_OTHER): Payer: 59 | Admitting: Family Medicine

## 2019-10-16 VITALS — BP 136/78 | Temp 98.1°F | Ht 70.0 in | Wt 184.6 lb

## 2019-10-16 DIAGNOSIS — Z0000 Encounter for general adult medical examination without abnormal findings: Secondary | ICD-10-CM

## 2019-10-16 MED ORDER — LEVOTHYROXINE SODIUM 112 MCG PO TABS: 112.0000 ug | ORAL_TABLET | Freq: Every day | ORAL | 3 refills | Status: DC

## 2019-10-16 NOTE — Progress Notes (Signed)
Subjective:    Patient ID: John Wu, male    DOB: Apr 22, 1966, 53 y.o.   MRN: [id number]  HPI The patient comes in today for a wellness visit.    A review of their health history was completed.  A review of medications was also completed.  Any needed refills; has enough Synthroid to get him through to the end of year  Eating habits: health conscious  Falls/  MVA accidents in past few months: none  Regular exercise: yes  Specialist pt sees on regular basis: no  Preventative health issues were discussed.   Additional concerns: none Results for orders placed or performed in visit on 10/05/19  Lipid panel  Result Value Ref Range   Cholesterol, Total 185 100 - 199 mg/dL   Triglycerides 177 (H) 0 - 149 mg/dL   HDL 36 (L) >39 mg/dL   VLDL Cholesterol Cal 31 5 - 40 mg/dL   LDL Chol Calc (NIH) 118 (H) 0 - 99 mg/dL   Chol/HDL Ratio 5.1 (H) 0.0 - 5.0 ratio  Hepatic function panel  Result Value Ref Range   Total Protein 6.0 6.0 - 8.5 g/dL   Albumin 4.2 3.8 - 4.9 g/dL   Bilirubin Total 0.6 0.0 - 1.2 mg/dL   Bilirubin, Direct 0.14 0.00 - 0.40 mg/dL   Alkaline Phosphatase 79 39 - 117 IU/L   AST 21 0 - 40 IU/L   ALT 26 0 - 44 IU/L  Basic metabolic panel  Result Value Ref Range   Glucose 102 (H) 65 - 99 mg/dL   BUN 19 6 - 24 mg/dL   Creatinine, Ser 1.05 0.76 - 1.27 mg/dL   GFR calc non Af Amer 80 >59 mL/min/1.73   GFR calc Af Amer 93 >59 mL/min/1.73   BUN/Creatinine Ratio 18 9 - 20   Sodium 142 134 - 144 mmol/L   Potassium 4.1 3.5 - 5.2 mmol/L   Chloride 105 96 - 106 mmol/L   CO2 25 20 - 29 mmol/L   Calcium 9.6 8.7 - 10.2 mg/dL  PSA  Result Value Ref Range   Prostate Specific Ag, Serum 1.0 0.0 - 4.0 ng/mL  TSH  Result Value Ref Range   TSH 3.750 0.450 - 4.500 uIU/mL   Exercising resdonable these days   Fair appetite and diwt   Last colon three yrs ago and next du five yrs after test  Review of Systems  Constitutional: Negative for activity change,  appetite change and fever.  HENT: Negative for congestion and rhinorrhea.   Eyes: Negative for discharge.  Respiratory: Negative for cough and wheezing.   Cardiovascular: Negative for chest pain.  Gastrointestinal: Negative for abdominal pain, blood in stool and vomiting.  Genitourinary: Negative for difficulty urinating and frequency.  Musculoskeletal: Negative for neck pain.  Skin: Negative for rash.  Allergic/Immunologic: Negative for environmental allergies and food allergies.  Neurological: Negative for weakness and headaches.  Psychiatric/Behavioral: Negative for agitation.       Objective:   Physical Exam Vitals signs reviewed.  Constitutional:      Appearance: He is well-developed.  HENT:     Head: Normocephalic and atraumatic.     Right Ear: External ear normal.     Left Ear: External ear normal.     Nose: Nose normal.  Eyes:     Pupils: Pupils are equal, round, and reactive to light.  Neck:     Musculoskeletal: Normal range of motion and neck supple.     Thyroid: No thyromegaly.  Cardiovascular:     Rate and Rhythm: Normal rate and regular rhythm.     Heart sounds: Normal heart sounds. No murmur.  Pulmonary:     Effort: Pulmonary effort is normal. No respiratory distress.     Breath sounds: Normal breath sounds. No wheezing.  Abdominal:     General: Bowel sounds are normal. There is no distension.     Palpations: Abdomen is soft. There is no mass.     Tenderness: There is no abdominal tenderness.  Genitourinary:    Penis: Normal.   Musculoskeletal: Normal range of motion.  Lymphadenopathy:     Cervical: No cervical adenopathy.  Skin:    General: Skin is warm and dry.     Findings: No erythema.  Neurological:     Mental Status: He is alert.     Motor: No abnormal muscle tone.  Psychiatric:        Behavior: Behavior normal.        Judgment: Judgment normal.           Assessment & Plan:  Impression well adult exam.  Just had flu shot.  Diet  discussed exercise discussed.  Up-to-date on colonoscopy Due in 2 years which will be the 5-year mark.  Blood work reviewed.  Some room for improvement on cholesterol and sugar but overall decent  2.  Hypothyroidism excellent control to maintain same rationale discussed

## 2019-10-16 NOTE — Patient Instructions (Signed)
Results for orders placed or performed in visit on 10/05/19  Lipid panel  Result Value Ref Range   Cholesterol, Total 185 100 - 199 mg/dL   Triglycerides 177 (H) 0 - 149 mg/dL   HDL 36 (L) >39 mg/dL   VLDL Cholesterol Cal 31 5 - 40 mg/dL   LDL Chol Calc (NIH) 118 (H) 0 - 99 mg/dL   Chol/HDL Ratio 5.1 (H) 0.0 - 5.0 ratio  Hepatic function panel  Result Value Ref Range   Total Protein 6.0 6.0 - 8.5 g/dL   Albumin 4.2 3.8 - 4.9 g/dL   Bilirubin Total 0.6 0.0 - 1.2 mg/dL   Bilirubin, Direct 0.14 0.00 - 0.40 mg/dL   Alkaline Phosphatase 79 39 - 117 IU/L   AST 21 0 - 40 IU/L   ALT 26 0 - 44 IU/L  Basic metabolic panel  Result Value Ref Range   Glucose 102 (H) 65 - 99 mg/dL   BUN 19 6 - 24 mg/dL   Creatinine, Ser 1.05 0.76 - 1.27 mg/dL   GFR calc non Af Amer 80 >59 mL/min/1.73   GFR calc Af Amer 93 >59 mL/min/1.73   BUN/Creatinine Ratio 18 9 - 20   Sodium 142 134 - 144 mmol/L   Potassium 4.1 3.5 - 5.2 mmol/L   Chloride 105 96 - 106 mmol/L   CO2 25 20 - 29 mmol/L   Calcium 9.6 8.7 - 10.2 mg/dL  PSA  Result Value Ref Range   Prostate Specific Ag, Serum 1.0 0.0 - 4.0 ng/mL  TSH  Result Value Ref Range   TSH 3.750 0.450 - 4.500 uIU/mL

## 2019-11-01 ENCOUNTER — Ambulatory Visit (INDEPENDENT_AMBULATORY_CARE_PROVIDER_SITE_OTHER): Payer: 59 | Admitting: Family Medicine

## 2019-11-01 ENCOUNTER — Encounter: Payer: Self-pay | Admitting: Family Medicine

## 2019-11-01 VITALS — Temp 98.0°F

## 2019-11-01 DIAGNOSIS — R079 Chest pain, unspecified: Secondary | ICD-10-CM | POA: Diagnosis not present

## 2019-11-01 DIAGNOSIS — F411 Generalized anxiety disorder: Secondary | ICD-10-CM | POA: Diagnosis not present

## 2019-11-01 MED ORDER — BUPROPION HCL ER (SR) 150 MG PO TB12: ORAL_TABLET | ORAL | 0 refills | Status: DC

## 2019-11-01 NOTE — Progress Notes (Signed)
   Subjective:    Patient ID: John Wu, male    DOB: May 16, 1966, 53 y.o.   MRN: [id number]  Anxiety Presents for follow-up visit. Primary symptoms comment: abdominal pain, feeling of soreness around bottom of sternum area, burning in abdomen,.    2 weeks ago pt stretched and the area of sternum that is having burning and pain, pt had really bad cramp Pt has been taking Nexium,Probiotic, and Magnesium daily.  Pt states that this time of year is hard also because of dads passing  Virtual Visit via Video Note  I connected with John Wu on 11/01/19 at  8:30 AM EST by a video enabled telemedicine application and verified that I am speaking with the correct person using two identifiers.  Location: Patient: home Provider: office   I discussed the limitations of evaluation and management by telemedicine and the availability of in person appointments. The patient expressed understanding and agreed to proceed.  History of Present Illness:    Observations/Objective:   Assessment and Plan:   Follow Up Instructions:    I discussed the assessment and treatment plan with the patient. The patient was provided an opportunity to ask questions and all were answered. The patient agreed with the plan and demonstrated an understanding of the instructions.   The patient was advised to call back or seek an in-person evaluation if the symptoms worsen or if the condition fails to improve as anticipated.  I provided 27 minutes of non-face-to-face time during this encounter.   Vicente Males, LPN Patient experiencing unfortunately rising anxiety once again.  Also accompanied by numerous physical symptomatologies.  Anterior chest discomfort.  Focus in the sternum area.  No cough no shortness of breath.  Positive family history of coronary artery disease  Patient feeling some upper abdominal symptoms also.  With reflux-like symptoms.  Colonoscopy in the past unremarkable  Having  rising anxiety.  And stress.  Feeling down at times.  Unfortunately nearing the anniversary of his father's death.  Took Lexapro in the past.  Unfortunately because an element of grogginess  Review of Systems No rash no weight loss no headache    Objective:   Physical Exam  Virtual      Assessment & Plan:  Impression 1 generalized anxiety disorder unfortunately worsening discussed really time to consider trying medication again.  Wellbutrin discussed and initiated  2.  Chest pain.  Doubt serious etiology however will initiate work-up as most pressing to assess to make sure things are okay.  Chest x-ray.  Cardiology referral rationale discussed  3.  GI symptoms.  We will see how things go the next few weeks on current treatments and adding the Wellbutrin  Follow-up in 1 month

## 2019-11-02 ENCOUNTER — Encounter: Payer: Self-pay | Admitting: Family Medicine

## 2019-11-19 ENCOUNTER — Other Ambulatory Visit: Payer: Self-pay

## 2019-11-19 ENCOUNTER — Encounter (HOSPITAL_COMMUNITY): Payer: Self-pay

## 2019-11-19 ENCOUNTER — Emergency Department (HOSPITAL_COMMUNITY): Payer: 59

## 2019-11-19 ENCOUNTER — Emergency Department (HOSPITAL_COMMUNITY)
Admission: EM | Admit: 2019-11-19 | Discharge: 2019-11-19 | Disposition: A | Discharge status: Home / Self Care | Payer: 59 | Attending: Emergency Medicine | Admitting: Emergency Medicine

## 2019-11-19 DIAGNOSIS — E039 Hypothyroidism, unspecified: Secondary | ICD-10-CM | POA: Insufficient documentation

## 2019-11-19 DIAGNOSIS — F419 Anxiety disorder, unspecified: Secondary | ICD-10-CM | POA: Diagnosis not present

## 2019-11-19 DIAGNOSIS — Z79899 Other long term (current) drug therapy: Secondary | ICD-10-CM | POA: Insufficient documentation

## 2019-11-19 DIAGNOSIS — R0789 Other chest pain: Secondary | ICD-10-CM | POA: Insufficient documentation

## 2019-11-19 DIAGNOSIS — R079 Chest pain, unspecified: Secondary | ICD-10-CM | POA: Diagnosis present

## 2019-11-19 HISTORY — DX: Anxiety disorder, unspecified: F41.9

## 2019-11-19 LAB — BASIC METABOLIC PANEL
Anion gap: 10 (ref 5–15)
BUN: 17 mg/dL (ref 6–20)
CO2: 28 mmol/L (ref 22–32)
Calcium: 9.5 mg/dL (ref 8.9–10.3)
Chloride: 103 mmol/L (ref 98–111)
Creatinine, Ser: 1.06 mg/dL (ref 0.61–1.24)
GFR calc Af Amer: >60 mL/min (ref >60)
GFR calc non Af Amer: >60 mL/min (ref >60)
Glucose, Bld: 113 mg/dL — ABNORMAL HIGH (ref 70–99)
Potassium: 3.7 mmol/L (ref 3.5–5.1)
Sodium: 141 mmol/L (ref 135–145)

## 2019-11-19 LAB — CBC
HCT: 45.3 % (ref 39.0–52.0)
Hemoglobin: 15.9 g/dL (ref 13.0–17.0)
MCH: 30.1 pg (ref 26.0–34.0)
MCHC: 35.1 g/dL (ref 30.0–36.0)
MCV: 85.8 fL (ref 80.0–100.0)
Platelets: 271 10*3/uL (ref 150–400)
RBC: 5.28 MIL/uL (ref 4.22–5.81)
RDW: 12.3 % (ref 11.5–15.5)
WBC: 8.4 10*3/uL (ref 4.0–10.5)
nRBC: 0 % (ref 0.0–0.2)

## 2019-11-19 LAB — TROPONIN I (HIGH SENSITIVITY): Troponin I (High Sensitivity): 2 ng/L (ref <18)

## 2019-11-19 MED ORDER — SODIUM CHLORIDE 0.9% FLUSH: 3.0000 mL | Freq: Once | INTRAVENOUS | Status: DC

## 2019-11-19 NOTE — ED Provider Notes (Signed)
[facility] EMERGENCY DEPARTMENT Provider Note   CSN: [id number] Arrival date & time: 11/19/19  1847     History   Chief Complaint Chief Complaint  Patient presents with  . Chest Pain    HPI John Wu is a 53 y.o. male.     HPI  This patient is a very pleasant 53 year old male with a known history of hypothyroidism, anxiety and irritable bowel syndrome.  He presents with a complaint of some chest discomfort which is somewhat vague however he states that it is sometimes in the right chest, sometimes burning and has been associated with increased amounts of anxiety as of recently.  The patient notes that over the last year or so he has struggled with bouts of anxiety during which time he feels like he loses weight, feels like he is having some somatic symptoms and in fact has had symptoms similar to this previously when diagnosed with anxiety.  He feels like he is perseverating on the thought that there is something wrong with him with regards to his medical status whether it is cancer, heart disease or some other significant concern.  He reports that he has had losses in the family, multiple stressors including people around him with coronavirus which raises more concern about his anxiety.  He has been treated by his family doctor Dr. Wolfgang Phoenix with Wellbutrin and reports that he is taking it.  He has been seen by a therapist and last saw the therapist today.  He is seeing this person regularly.  At this time the patient has a vague symptom in his chest, states that it is nonexertional, in fact he is able to walk 2 to 4 miles per day aggressively without any symptoms.  He reports that he likes to exercise and never has symptoms in his chest with exercise.  He does not smoke, he does not have diabetes, there is some family history of heart disease.  Past Medical History:  Diagnosis Date  . Anxiety   . Hypothyroidism   . IBS (irritable bowel syndrome)     Patient Active Problem List    Diagnosis Date Noted  . Generalized anxiety disorder 03/20/2018  . Contusion of forearm, right 04/23/2014  . Hypothyroidism 05/23/2013  . PARONYCHIA FINGER 12/15/2009  . TENDINITIS, TIBIALIS 12/15/2009    Past Surgical History:  Procedure Laterality Date  . CHOLECYSTECTOMY  2007  . VASECTOMY  2000        Home Medications    Prior to Admission medications   Medication Sig Start Date End Date Taking? Authorizing Provider  buPROPion [facility] SR) 150 MG 12 hr tablet Take one tablet each morning for 3 days then take one tablet po BID 11/01/19   [name], MD  levothyroxine (SYNTHROID) 112 MCG tablet Take 1 tablet (112 mcg total) by mouth daily. 10/16/19   [name], MD    Family History Family History  Problem Relation Age of Onset  . Hypertension Father   . Heart attack Father   . Colon cancer Neg Hx     Social History Social History   Tobacco Use  . Smoking status: Never Smoker  . Smokeless tobacco: Never Used  Substance Use Topics  . Alcohol use: No    Frequency: Never  . Drug use: No     Allergies   Lexapro [escitalopram oxalate]   Review of Systems Review of Systems  All other systems reviewed and are negative.    Physical Exam Updated Vital Signs  BP 137/86 (BP Location: Right Arm)   Pulse 90   Temp 98.6 F (37 C) (Oral)   Resp 18   Ht 1.778 m (5\' 10" )   Wt 81.6 kg   SpO2 99%   BMI 25.83 kg/m   Physical Exam Vitals signs and nursing note reviewed.  Constitutional:      General: He is not in acute distress.    Appearance: He is well-developed.  HENT:     Head: Normocephalic and atraumatic.     Mouth/Throat:     Pharynx: No oropharyngeal exudate.  Eyes:     General: No scleral icterus.       Right eye: No discharge.        Left eye: No discharge.     Conjunctiva/sclera: Conjunctivae normal.     Pupils: Pupils are equal, round, and reactive to light.  Neck:     Musculoskeletal: Normal range of motion and neck  supple.     Thyroid: No thyromegaly.     Vascular: No JVD.  Cardiovascular:     Rate and Rhythm: Normal rate and regular rhythm.     Heart sounds: Normal heart sounds. No murmur. No friction rub. No gallop.   Pulmonary:     Effort: Pulmonary effort is normal. No respiratory distress.     Breath sounds: Normal breath sounds. No wheezing or rales.  Abdominal:     General: Bowel sounds are normal. There is no distension.     Palpations: Abdomen is soft. There is no mass.     Tenderness: There is no abdominal tenderness.  Musculoskeletal: Normal range of motion.        General: No tenderness.  Lymphadenopathy:     Cervical: No cervical adenopathy.  Skin:    General: Skin is warm and dry.     Findings: No erythema or rash.  Neurological:     Mental Status: He is alert.     Coordination: Coordination normal.  Psychiatric:        Behavior: Behavior normal.     Comments: The patient appears mildly anxious, endorses having unrealistic anxiety, no hallucinations, not responding to internal stimuli.      ED Treatments / Results  Labs (all labs ordered are listed, but only abnormal results are displayed) Labs Reviewed  BASIC METABOLIC PANEL - Abnormal; Notable for the following components:      Result Value   Glucose, Bld 113 (*)    All other components within normal limits  CBC  TROPONIN I (HIGH SENSITIVITY)  TROPONIN I (HIGH SENSITIVITY)    EKG EKG Interpretation  Date/Time:  Monday November 19 2019 19:34:01 EST Ventricular Rate:  91 PR Interval:  142 QRS Duration: 80 QT Interval:  350 QTC Calculation: 430 R Axis:   64 Text Interpretation: Normal sinus rhythm Normal ECG since last tracing no significant change Confirmed by [name] [id number]) on 11/19/2019 7:52:03 PM   Radiology Dg Chest 2 View  Result Date: 11/19/2019 CLINICAL DATA:  Chest pain EXAM: CHEST - 2 VIEW COMPARISON:  March 08, 2018 FINDINGS: The heart size and mediastinal contours are within normal  limits. Both lungs are clear. The visualized skeletal structures are unremarkable. IMPRESSION: No active cardiopulmonary disease. Electronically Signed   By: Constance Holster M.D.   On: 11/19/2019 20:36    Procedures Procedures (including critical care time)  Medications Ordered in ED Medications  sodium chloride flush (NS) 0.9 % injection 3 mL (3 mLs Intravenous Not Given 11/19/19 2052)  Initial Impression / Assessment and Plan / ED Course  I have reviewed the triage vital signs and the nursing notes.  Pertinent labs & imaging results that were available during my care of the patient were reviewed by me and considered in my medical decision making (see chart for details).        This patient definitely appears to have some anxiety, his medical exam is unremarkable, his blood pressure was minimally elevated at 137/86 but he is afebrile, pulse of 90, EKG which is unremarkable and nonischemic.  I doubt pulmonary embolism, acid reflux and this is most likely related to anxiety.  We will check a troponin, chest x-ray and a CBC with a basic metabolic panel, he has already been referred to cardiology as an outpatient by his family doctor.  Patient is agreeable to the plan.  X-ray and lab work is totally unremarkable, the patient was given reassurance, his x-ray is totally normal showing no signs of infiltrate.  EKG also unremarkable as was the troponin.  Stable for discharge at this time.  Final Clinical Impressions(s) / ED Diagnoses   Final diagnoses:  Atypical chest pain  Anxiety    ED Discharge Orders    None       [name], MD 11/19/19 2240

## 2019-11-19 NOTE — ED Triage Notes (Signed)
Pt presents to ED with right chest soreness and burning started today. Pt states he has been having some anxiety and been treated by Dr Wolfgang Phoenix and seeing therapist. Pt denies SOB, nausea. Pt states he did hyperventilate some today.

## 2019-11-19 NOTE — Discharge Instructions (Signed)
Rest assured that your x-ray and blood work and EKG have all been reassuring and show no signs of tumors, cancer, lung abnormalities or heart abnormalities.  Everything was very normal.  This may very well be related to anxiety, so please keep your appointments with your therapist, your family doctor and psychiatry if they refer you there.

## 2019-11-20 ENCOUNTER — Ambulatory Visit (INDEPENDENT_AMBULATORY_CARE_PROVIDER_SITE_OTHER): Payer: 59 | Admitting: Family Medicine

## 2019-11-20 VITALS — BP 136/86 | Temp 98.0°F | Wt 175.8 lb

## 2019-11-20 DIAGNOSIS — F411 Generalized anxiety disorder: Secondary | ICD-10-CM | POA: Diagnosis not present

## 2019-11-20 NOTE — Progress Notes (Signed)
   Subjective:    Patient ID: John Wu, male    DOB: [birth year], 53 y.o.   MRN: [id number]  HPI Patient arrives for a follow up on anxiety and chest pain. Patient states he went to ER last night for burning in his chest and a lot of anxiety. Patient states they ran a lot of tests and with good news he feels better today.  Card  Referral      Walking around most days  Trying to  Do two miles per day  Patient reports ongoing substantial anxiety unfortunately.  He worries about a lot of things.  Currently we have a cardiology referral pending for atypical chest pain.  Patient is also concerned and it he has lost a few pounds.  He is down 6 or 7 pounds compared to a half a year ago.  But he has also been very anxious and his appetite has not been the best.  Does report some stress at home and at work primarily with anxiety regarding every day stress plus work challenges plus COVID-19 concerns Review of Systems No headache, no major weight loss or weight gain, no chest pain no back pain abdominal pain no change in bowel habits complete ROS otherwise negative     Objective:   Physical Exam  Alert and oriented, vitals reviewed and stable, NAD ENT-TM's and ext canals WNL bilat via otoscopic exam Soft palate, tonsils and post pharynx WNL via oropharyngeal exam Neck-symmetric, no masses; thyroid nonpalpable and nontender Pulmonary-no tachypnea or accessory muscle use; Clear without wheezes via auscultation Card--no abnrml murmurs, rhythm reg and rate WNL Carotid pulses symmetric, without bruits       Assessment & Plan:  N generalized anxiety disorder fairly severe discussed will maintain chronic meds plus we will add as needed benzodiazepine  2.  Chest pain nonspecific we will go ahead and have patient see cardiology as scheduled  3.  Weight loss likely due to patient's anxiety.  Discussed the need to get through the cardiology referral before adding yet another referral.   Follow-up as scheduled medications prescribed

## 2019-11-21 ENCOUNTER — Telehealth: Payer: Self-pay | Admitting: Family Medicine

## 2019-11-21 MED ORDER — LORAZEPAM 0.5 MG PO TABS: ORAL_TABLET | ORAL | 2 refills | Status: DC

## 2019-11-21 NOTE — Telephone Encounter (Signed)
Prescription faxed to pharmacy. Patient notified. 

## 2019-11-21 NOTE — Telephone Encounter (Signed)
Pt is checking status of LORazepam (ATIVAN) 0.5 MG tablet being called into pharmacy.   Pt had visit yesterday.   [place], [place]

## 2019-11-21 NOTE — Telephone Encounter (Signed)
Ativan 0.5 mg numb 24  two ref one qd prn anxiety drowsy precautions

## 2019-11-23 ENCOUNTER — Telehealth: Payer: Self-pay | Admitting: Family Medicine

## 2019-11-23 DIAGNOSIS — R634 Abnormal weight loss: Secondary | ICD-10-CM

## 2019-11-23 DIAGNOSIS — R109 Unspecified abdominal pain: Secondary | ICD-10-CM

## 2019-11-23 DIAGNOSIS — F419 Anxiety disorder, unspecified: Secondary | ICD-10-CM

## 2019-11-23 NOTE — Addendum Note (Signed)
Addended by: Carmelina Noun on: 11/23/2019 11:43 AM   Modules accepted: Orders

## 2019-11-23 NOTE — Telephone Encounter (Signed)
Referrals put in and psychiatry put in as urgent. Prilosec 20mg  qd added to med list.

## 2019-11-23 NOTE — Telephone Encounter (Signed)
Pt wife called office stating that pt is still having RUQ pain. Pt has been to ER for chest pain, seen on 11/20/2019. Pt has been prescribed Wellbutrin and Ativan. Pt will only take Ativan if he is made. Wife states that she has to talk him down everyday. Pt is afraid that he may have cancer and wife is having to talk him down. The RUQ pain is not constant and does not make him double over. Pt is not having any chest pain. Pt has chest xray done when he went to ER on 11/19/2019. No suicidal intentions per wife. Their daughter is with patient at home. Wife is needing advice on what to do. Please advise. Thank you.

## 2019-11-23 NOTE — Telephone Encounter (Signed)
Substantial challenges with worsening anxiety and ongoing concerns re cancer and weight loss. Two referrals: psychiatry ref  at [facility](already seeing counselor there) Psychiatry NOT PSYCHOLOGY. ALSO, REF BACT to HIS G I DR FOR WEIGHT LOSS And abdominal pain. Do spychiatry ref asap plz(pt also advised to tak e priloces 20 qd)

## 2019-11-26 ENCOUNTER — Encounter: Payer: Self-pay | Admitting: Family Medicine

## 2019-11-27 ENCOUNTER — Encounter: Payer: Self-pay | Admitting: Family Medicine

## 2019-11-28 NOTE — Progress Notes (Signed)
CARDIOLOGY CONSULT NOTE       Patient ID: John Wu MRN: [id number] DOB/AGE: 55-15-1967 55 y.o.  Admit date: (Not on file) Referring Physician: [name] Primary Physician: [name], MD Primary Cardiologist: New Reason for Consultation: Chest Pain   Active Problems:   * No active hospital problems. *   HPI:  53 y.o. history of hypothyroidism, IBS and anxiety seen in [facility] 11/19/19 with chest pain Pain atypical burning and right sided Not necessarily exertional Associated with anxiety Concerned that he has cancer or heart disease. On Welbutrin and sees a therapist Concerns about COVID as well Walks 2-4 miles daily with no issues R/O in ER with normal CXR and ECG  He continues to have overwhelming anxiety and somatics to other body parts He is going to see a psycniatrist in 2 weeks   I took care of his dad [name] who past away in January   ROS All other systems reviewed and negative except as noted above  Past Medical History:  Diagnosis Date  . Anxiety   . Hypothyroidism   . IBS (irritable bowel syndrome)     Family History  Problem Relation Age of Onset  . Hypertension Father   . Heart attack Father   . Colon cancer Neg Hx     Social History   Socioeconomic History  . Marital status: Married    Spouse name: Not on file  . Number of children: Not on file  . Years of education: Not on file  . Highest education level: Not on file  Occupational History  . Not on file  Tobacco Use  . Smoking status: Never Smoker  . Smokeless tobacco: Never Used  Substance and Sexual Activity  . Alcohol use: No  . Drug use: No  . Sexual activity: Not on file  Other Topics Concern  . Not on file  Social History Narrative  . Not on file   Social Determinants of Health   Financial Resource Strain:   . Difficulty of Paying Living Expenses: Not on file  Food Insecurity:   . Worried About Charity fundraiser in the Last Year: Not on file  . Ran Out of  Food in the Last Year: Not on file  Transportation Needs:   . Lack of Transportation (Medical): Not on file  . Lack of Transportation (Non-Medical): Not on file  Physical Activity:   . Days of Exercise per Week: Not on file  . Minutes of Exercise per Session: Not on file  Stress:   . Feeling of Stress : Not on file  Social Connections:   . Frequency of Communication with Friends and Family: Not on file  . Frequency of Social Gatherings with Friends and Family: Not on file  . Attends Religious Services: Not on file  . Active Member of Clubs or Organizations: Not on file  . Attends Archivist Meetings: Not on file  . Marital Status: Not on file  Intimate Partner Violence:   . Fear of Current or Ex-Partner: Not on file  . Emotionally Abused: Not on file  . Physically Abused: Not on file  . Sexually Abused: Not on file    Past Surgical History:  Procedure Laterality Date  . CHOLECYSTECTOMY  2007  . VASECTOMY  2000        Physical Exam: Blood pressure 125/78, pulse 98, temperature (!) 97.3 F (36.3 C), height 5\' 10"  (1.778 m), weight 175 lb (79.4 kg), SpO2 98 %.  Affect appropriate Healthy:  appears stated age 13: normal Neck supple with no adenopathy JVP normal no bruits no thyromegaly Lungs clear with no wheezing and good diaphragmatic motion Heart:  S1/S2 no murmur, no rub, gallop or click PMI normal Abdomen: benighn, BS positve, no tenderness, no AAA no bruit.  No HSM or HJR Distal pulses intact with no bruits No edema Neuro non-focal Skin warm and dry No muscular weakness   Labs:   Lab Results  Component Value Date   WBC 8.4 11/19/2019   HGB 15.9 11/19/2019   HCT 45.3 11/19/2019   MCV 85.8 11/19/2019   PLT 271 11/19/2019   No results for input(s): NA, K, CL, CO2, BUN, CREATININE, CALCIUM, PROT, BILITOT, ALKPHOS, ALT, AST, GLUCOSE in the last 168 hours.  Invalid input(s): LABALBU No results found for: CKTOTAL, CKMB, CKMBINDEX, TROPONINI   Lab Results  Component Value Date   CHOL 185 10/10/2019   CHOL 195 10/04/2018   CHOL 179 09/07/2017   Lab Results  Component Value Date   HDL 36 (L) 10/10/2019   HDL 39 (L) 10/04/2018   HDL 44 09/07/2017   Lab Results  Component Value Date   LDLCALC 118 (H) 10/10/2019   LDLCALC 123 (H) 10/04/2018   LDLCALC 111 (H) 09/07/2017   Lab Results  Component Value Date   TRIG 177 (H) 10/10/2019   TRIG 167 (H) 10/04/2018   TRIG 121 09/07/2017   Lab Results  Component Value Date   CHOLHDL 5.1 (H) 10/10/2019   CHOLHDL 5.0 10/04/2018   CHOLHDL 4.1 09/07/2017   No results found for: LDLDIRECT    Radiology: DG Chest 2 View  Result Date: 11/19/2019 CLINICAL DATA:  Chest pain EXAM: CHEST - 2 VIEW COMPARISON:  March 08, 2018 FINDINGS: The heart size and mediastinal contours are within normal limits. Both lungs are clear. The visualized skeletal structures are unremarkable. IMPRESSION: No active cardiopulmonary disease. Electronically Signed   By: Constance Holster M.D.   On: 11/19/2019 20:36    EKG: 11/20/19 NSR normal ECG rate 78 bpm   ASSESSMENT AND PLAN:   1. Chest Pain:  Atypical normal labs ECG and CXR No need for any testing at this time Will see In 6 months and see if any symptoms persist once anxiety disorder under better control   2. Thyroid:  On replacement TSH normal 10/10/19  3. Anxiety: major issue causing somatic issues On Welbutrin f/u with Dr Wolfgang Phoenix and therapist   Signed: Jenkins Rouge 12/04/2019, 1:38 PM

## 2019-11-30 ENCOUNTER — Ambulatory Visit: Payer: 59 | Admitting: Family Medicine

## 2019-12-04 ENCOUNTER — Encounter: Payer: Self-pay | Admitting: Cardiovascular Disease

## 2019-12-04 ENCOUNTER — Ambulatory Visit (INDEPENDENT_AMBULATORY_CARE_PROVIDER_SITE_OTHER): Payer: 59 | Admitting: Cardiovascular Disease

## 2019-12-04 ENCOUNTER — Other Ambulatory Visit: Payer: Self-pay

## 2019-12-04 VITALS — BP 125/78 | HR 98 | Temp 97.3°F | Ht 70.0 in | Wt 175.0 lb

## 2019-12-04 DIAGNOSIS — R079 Chest pain, unspecified: Secondary | ICD-10-CM | POA: Diagnosis not present

## 2019-12-04 NOTE — Patient Instructions (Signed)

## 2019-12-17 ENCOUNTER — Telehealth: Payer: Self-pay | Admitting: Family Medicine

## 2019-12-17 MED ORDER — BUPROPION HCL ER (SR) 150 MG PO TB12: ORAL_TABLET | ORAL | 0 refills | Status: DC

## 2019-12-17 NOTE — Telephone Encounter (Signed)
Pt wants to know if Dr. Richardson Landry wants him to continue medication or to wait until appt with phsyicatrist 12/24/19.   buPROPion (WELLBUTRIN SR) 150 MG 12 hr tablet  [place], [place] [facility] [place]

## 2019-12-17 NOTE — Telephone Encounter (Signed)
Ye s cont and refill times three

## 2019-12-17 NOTE — Telephone Encounter (Signed)
Pt contacted and verbalized understanding. Medication sent to pharmacy  

## 2019-12-17 NOTE — Telephone Encounter (Signed)
Please advise. Thank you

## 2019-12-18 ENCOUNTER — Other Ambulatory Visit (INDEPENDENT_AMBULATORY_CARE_PROVIDER_SITE_OTHER): Payer: 59

## 2019-12-18 ENCOUNTER — Ambulatory Visit (INDEPENDENT_AMBULATORY_CARE_PROVIDER_SITE_OTHER): Payer: 59 | Admitting: Physician Assistant

## 2019-12-18 ENCOUNTER — Encounter: Payer: Self-pay | Admitting: Physician Assistant

## 2019-12-18 VITALS — BP 136/78 | HR 85 | Temp 98.3°F | Ht 70.0 in | Wt 173.0 lb

## 2019-12-18 DIAGNOSIS — R1084 Generalized abdominal pain: Secondary | ICD-10-CM | POA: Diagnosis not present

## 2019-12-18 DIAGNOSIS — R079 Chest pain, unspecified: Secondary | ICD-10-CM

## 2019-12-18 DIAGNOSIS — R634 Abnormal weight loss: Secondary | ICD-10-CM

## 2019-12-18 DIAGNOSIS — R110 Nausea: Secondary | ICD-10-CM

## 2019-12-18 LAB — HIGH SENSITIVITY CRP: CRP, High Sensitivity: 0.42 mg/L (ref 0.000–5.000)

## 2019-12-18 LAB — COMPREHENSIVE METABOLIC PANEL
ALT: 19 U/L (ref 0–53)
AST: 14 U/L (ref 0–37)
Albumin: 4.5 g/dL (ref 3.5–5.2)
Alkaline Phosphatase: 69 U/L (ref 39–117)
BUN: 23 mg/dL (ref 6–23)
CO2: 32 mEq/L (ref 19–32)
Calcium: 9.9 mg/dL (ref 8.4–10.5)
Chloride: 103 mEq/L (ref 96–112)
Creatinine, Ser: 1.27 mg/dL (ref 0.40–1.50)
GFR: 58.85 mL/min — ABNORMAL LOW (ref >60.00)
Glucose, Bld: 116 mg/dL — ABNORMAL HIGH (ref 70–99)
Potassium: 4.3 mEq/L (ref 3.5–5.1)
Sodium: 140 mEq/L (ref 135–145)
Total Bilirubin: 0.5 mg/dL (ref 0.2–1.2)
Total Protein: 6.5 g/dL (ref 6.0–8.3)

## 2019-12-18 LAB — TSH: TSH: 2.45 u[IU]/mL (ref 0.35–4.50)

## 2019-12-18 LAB — SEDIMENTATION RATE: Sed Rate: 1 mm/hr (ref 0–20)

## 2019-12-18 NOTE — Patient Instructions (Addendum)
If you are age 56 or older, your body mass index should be between 23-30. Your Body mass index is 24.82 kg/m. If this is out of the aforementioned range listed, please consider follow up with your Primary Care Provider.  If you are age 40 or younger, your body mass index should be between 19-25. Your Body mass index is 24.82 kg/m. If this is out of the aformentioned range listed, please consider follow up with your Primary Care Provider.   You have been scheduled for a CT scan of the abdomen and pelvis AND Chest at [facility] ([address]---this is in the same building as [employer]).   You are scheduled on 12/20/19 at 3:30pm. You should arrive 15 minutes prior to your appointment time for registration. Please follow the written instructions below on the day of your exam:  WARNING: IF YOU ARE ALLERGIC TO IODINE/X-RAY DYE, PLEASE NOTIFY RADIOLOGY IMMEDIATELY AT [phone]! YOU WILL BE GIVEN A 13 HOUR PREMEDICATION PREP.  1) Do not eat anything after 11:30 am (4 hours prior to your test) 2) You have been given 2 bottles of oral contrast to drink. The solution may taste better if refrigerated, but do NOT add ice or any other liquid to this solution. Shake well before drinking.    Drink 1 bottle of contrast @ 1:30 pm (2 hours prior to your exam)  Drink 1 bottle of contrast @ 2:30 pm (1 hour prior to your exam)  You may take any medications as prescribed with a small amount of water, if necessary. If you take any of the following medications: METFORMIN, GLUCOPHAGE, GLUCOVANCE, AVANDAMET, RIOMET, FORTAMET, [place] MET, JANUMET, GLUMETZA or METAGLIP, you MAY be asked to HOLD this medication 48 hours AFTER the exam.  The purpose of you drinking the oral contrast is to aid in the visualization of your intestinal tract. The contrast solution may cause some diarrhea. Depending on your individual set of symptoms, you may also receive an intravenous injection of x-ray  contrast/dye. Plan on being at [facility] for 30 minutes or longer, depending on the type of exam you are having performed.  This test typically takes 30-45 minutes to complete.  If you have any questions regarding your exam or if you need to reschedule, you may call the CT department at [phone] between the hours of 8:00 am and 5:00 pm, Monday-Friday.  ________________________________________________________________________   Stop Naproxen.  Start over-the-counter Nexium twice daily before breakfast and before dinner.  Thank you for choosing me and [facility].   Amy Esterwood, PA-C  Due to recent changes in healthcare laws, you may see the results of your imaging and laboratory studies on MyChart before your provider has had a chance to review them.  We understand that in some cases there may be results that are confusing or concerning to you. Not all laboratory results come back in the same time frame and the provider may be waiting for multiple results in order to interpret others.  Please give [place] 48 hours in order for your provider to thoroughly review all the results before contacting the office for clarification of your results.

## 2019-12-18 NOTE — Progress Notes (Signed)
Excellent note. Reviewed. Agree with EGD unless other indicated

## 2019-12-18 NOTE — Progress Notes (Signed)
Subjective:    Patient ID: John Wu, male    DOB: 1966/05/29, 54 y.o.   MRN: [id number]  HPI John Wu is a pleasant 54 year old white male, known to Dr. Henrene Pastor from prior colonoscopy done in 2017 who comes in today with complaints of chest pain, abdominal pain and weight loss. Colonoscopy in 2017 revealed a few sigmoid diverticuli, internal hemorrhoids and he had one 4 mm polyp removed which was a tubular adenoma.  He is indicated for 5-year interval follow-up. Patient has history of hypothyroidism, generalized anxiety disorder and IBS. He states today that he has been having a lot of problems with anxiety recently.  He had an ER visit on 11/19/2019 with complaints of atypical chest pain.  He describes it as a burning type pain.  He had work-up with baseline labs, chest x-ray and EKG which were unremarkable.  He was then referred to cardiology and was seen by Dr. [name] on 12/04/2019.Marland Kitchen  He did not think that he needed any further cardiac work-up as symptoms were nonexertional and patient had been walking 2 to 4 miles per day without difficulty.  He felt he was having issues with overwhelming anxiety accounting for complaints of chest pain.  Patient has been seen by a therapist, and has appointment with a psychiatrist next week.  He had been started on Wellbutrin by his PCP about 2 months ago.  He also has a longer-term prescription for Ativan which he finds helpful but very rarely uses. He has not noticed any major improvement in symptoms with Wellbutrin.  He is very concerned now because his appetite is decreased and he is lost about 9 pounds over the past 5 weeks.  He says he became more anxious when he realized that he was losing weight.  He is "worried" about intermittent burning or warm sensation in his abdomen that comes and goes.  This appears rather generalized.  He has not had any changes in bowel habits.  He denies any heartburn or indigestion, no dysphagia. Apparently he did have a rib  injury a year or so ago but has not had any more recent chest wall injury.  He feels that he had some tenderness in the right chest wall a few weeks ago which is no longer present. Patient remarks several times during the interview that he is worried that something is physically wrong, and says his mind keeps taking him to cancer etc. He has been using some OTC naproxen over the past 2 weeks which was advised as a trial for potential chest wall pain.  Review of Systems Pertinent positive and negative review of systems were noted in the above HPI section.  All other review of systems was otherwise negative.  Outpatient Encounter Medications as of 56/04/2020  Medication Sig  . buPROPion (WELLBUTRIN SR) 150 MG 12 hr tablet Take one tablet each morning for 3 days then take one tablet po BID  . levothyroxine (SYNTHROID) 112 MCG tablet Take 1 tablet (112 mcg total) by mouth daily.  Marland Kitchen LORazepam (ATIVAN) 0.5 MG tablet One tab daily as needed for anxiety   No facility-administered encounter medications on file as of 12/18/2019.   Allergies  Allergen Reactions  . Lexapro [Escitalopram Oxalate] Other (See Comments)    grogginess   Patient Active Problem List   Diagnosis Date Noted  . Generalized anxiety disorder 03/20/2018  . Contusion of forearm, right 04/23/2014  . Hypothyroidism 05/23/2013  . PARONYCHIA FINGER 12/15/2009  . TENDINITIS, TIBIALIS 12/15/2009   Social  History   Socioeconomic History  . Marital status: Married    Spouse name: [name]  . Number of children: 2  . Years of education: Not on file  . Highest education level: Not on file  Occupational History  . Not on file  Tobacco Use  . Smoking status: Never Smoker  . Smokeless tobacco: Never Used  Substance and Sexual Activity  . Alcohol use: No  . Drug use: No  . Sexual activity: Yes  Other Topics Concern  . Not on file  Social History Narrative  . Not on file   Social Determinants of Health   Financial Resource  Strain:   . Difficulty of Paying Living Expenses: Not on file  Food Insecurity:   . Worried About Charity fundraiser in the Last Year: Not on file  . Ran Out of Food in the Last Year: Not on file  Transportation Needs:   . Lack of Transportation (Medical): Not on file  . Lack of Transportation (Non-Medical): Not on file  Physical Activity:   . Days of Exercise per Week: Not on file  . Minutes of Exercise per Session: Not on file  Stress:   . Feeling of Stress : Not on file  Social Connections:   . Frequency of Communication with Friends and Family: Not on file  . Frequency of Social Gatherings with Friends and Family: Not on file  . Attends Religious Services: Not on file  . Active Member of Clubs or Organizations: Not on file  . Attends Archivist Meetings: Not on file  . Marital Status: Not on file  Intimate Partner Violence:   . Fear of Current or Ex-Partner: Not on file  . Emotionally Abused: Not on file  . Physically Abused: Not on file  . Sexually Abused: Not on file    John Wu family history includes Benign prostatic hyperplasia in his brother; Heart attack in his father; Hypertension in his father; Lung cancer in his father.      Objective:    Vitals:   12/18/19 1550  BP: 136/78  Pulse: 85  Temp: 98.3 F (36.8 C)    Physical Exam Well-developed well-nourished white male in no acute distress.  , Weight 173, BMI 24.8.  Patient is accompanied by his wife/pharmacist with [place]  HEENT; nontraumatic normocephalic, EOMI, PER R LA, sclera anicteric. Oropharynx; not examined/mask/Covid Neck; supple, no JVD Cardiovascular; regular rate and rhythm with S1-S2, no murmur rub or gallop.  He has no focal chest wall tenderness or sternal/xiphoid tenderness Pulmonary; Clear bilaterally Abdomen; soft, nontender, nondistended, no palpable mass or hepatosplenomegaly, bowel sounds are active Rectal; not done today Skin; benign exam, no jaundice rash or  appreciable lesions Extremities; no clubbing cyanosis or edema skin warm and dry Neuro/Psych; alert and oriented x4, grossly nonfocal , mood and affect appropriate though clearly anxious       Assessment & Plan:   #73 54 year old white male with 71-month history of progressive generalized anxiety, thus far with no improvement on trial of Wellbutrin. #2.  Atypical chest pain-by description likely chest wall pain though no focal tenderness on exam today. #3 weight loss-I suspect this is side effect of Wellbutrin, but need to rule out other underlying pathology #4.  Intermittent generalized burning abdominal discomfort, etiology not clear, symptoms not typical for gastritis or peptic ulcer disease is rather generalized.  This may be manifestation of anxiety with some matization.  #5 history of adenomatous colon polyp-up-to-date with colonoscopy due for  follow-up 2022 #6 diverticulosis  Plan; patient has no overwhelming anxiety regarding his physical symptoms currently.  Will proceed with further evaluation to include CT scan of the chest abdomen and pelvis. Check hepatic panel sed rate and CRP. Stop naproxen Start trial of OTC Nexium 1 p.o. twice daily AC breakfast and AC dinner. Consider EGD with Dr. Henrene Pastor if above work-up negative. Patient encouraged to push oral intake. Strongly advised to keep his appointment with psychiatry next week.  May need to stop Wellbutrin, and start other alternative regimen for severe anxiety.  John Wu John Harold PA-C 12/18/2019   Cc: [name], MD

## 2019-12-19 ENCOUNTER — Telehealth: Payer: Self-pay | Admitting: Physician Assistant

## 2019-12-19 NOTE — Telephone Encounter (Signed)
John Wu can you follow this up for me please? I forgot I would not be in the office to take care of it.

## 2019-12-19 NOTE — Telephone Encounter (Signed)
Spoke with the patient. He has seen his results in the My Chart patient portal. He requests that he be contacted as soon as the are reviewed by the provider. He feels that what he has seen is good. Admits to being very anxious about this. He will have imaging tomorrow. Hopes he will know something on that before the weekend.

## 2019-12-20 ENCOUNTER — Other Ambulatory Visit: Payer: Self-pay

## 2019-12-20 ENCOUNTER — Ambulatory Visit (INDEPENDENT_AMBULATORY_CARE_PROVIDER_SITE_OTHER)
Admission: RE | Admit: 2019-12-20 | Discharge: 2019-12-20 | Disposition: A | Discharge status: Home / Self Care | Payer: 59 | Source: Ambulatory Visit | Attending: Physician Assistant | Admitting: Physician Assistant

## 2019-12-20 ENCOUNTER — Telehealth: Payer: Self-pay | Admitting: Physician Assistant

## 2019-12-20 DIAGNOSIS — R1084 Generalized abdominal pain: Secondary | ICD-10-CM | POA: Diagnosis not present

## 2019-12-20 DIAGNOSIS — R079 Chest pain, unspecified: Secondary | ICD-10-CM | POA: Diagnosis not present

## 2019-12-20 DIAGNOSIS — R634 Abnormal weight loss: Secondary | ICD-10-CM | POA: Diagnosis not present

## 2019-12-20 DIAGNOSIS — R110 Nausea: Secondary | ICD-10-CM

## 2019-12-20 MED ORDER — IOHEXOL 300 MG/ML  SOLN
100.0000 mL | Freq: Once | INTRAMUSCULAR | Status: AC | PRN
  Administered 2019-12-20: 16:00:00 100 mL via INTRAVENOUS

## 2019-12-20 NOTE — Telephone Encounter (Signed)
Spoke with pt and let him know he will receive a call when the results are back and have been reviewed. Pt aware.

## 2019-12-20 NOTE — Telephone Encounter (Signed)
See notes below, pt calling for lab results. Please advise.

## 2019-12-24 ENCOUNTER — Encounter: Payer: Self-pay | Admitting: Adult Health

## 2019-12-24 ENCOUNTER — Other Ambulatory Visit: Payer: Self-pay

## 2019-12-24 ENCOUNTER — Ambulatory Visit (INDEPENDENT_AMBULATORY_CARE_PROVIDER_SITE_OTHER): Payer: 59 | Admitting: Adult Health

## 2019-12-24 DIAGNOSIS — F331 Major depressive disorder, recurrent, moderate: Secondary | ICD-10-CM

## 2019-12-24 DIAGNOSIS — G4700 Insomnia, unspecified: Secondary | ICD-10-CM

## 2019-12-24 DIAGNOSIS — F429 Obsessive-compulsive disorder, unspecified: Secondary | ICD-10-CM

## 2019-12-24 DIAGNOSIS — F411 Generalized anxiety disorder: Secondary | ICD-10-CM | POA: Diagnosis not present

## 2019-12-24 MED ORDER — FLUOXETINE HCL 20 MG PO CAPS: 20.0000 mg | ORAL_CAPSULE | Freq: Every day | ORAL | 5 refills | Status: DC

## 2019-12-24 MED FILL — FLUoxetine HCL 20 MG CAPS: 20 | 30 days supply | Qty: 30 | Fill #0

## 2019-12-24 NOTE — Progress Notes (Signed)
Please call patient and let him know the CT scan of the chest is negative. CT of the abdomen and pelvis shows some atherosclerosis, and a 2.4 cm cyst of the right kidney, and a 2.8 cm cyst right kidney which has some increased density, and therefore radiologist recommended MRI demand with and without to be absolutely certain this cyst is benign, which it likely is. Please go ahead and get him scheduled for MRI of the abdomen with and without contrast.

## 2019-12-24 NOTE — Progress Notes (Signed)
[facility] MD/PA/NP Initial Note  12/24/2019 2:55 PM TALTON ROEDL  MRN:  [id number]  Chief Complaint:   HPI:   Describes mood today as "ok". Pleasant. Mood symptoms - reports depression, anxiety, and irritability. Lost father earlier this year. Feeling some "guilt and loneliness". Mother living alone. Has tried to help family out since his loss. Talking to a therapist. Seen in the ED earlier in December for anxiety and pain. 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 Was off the month of December for vacation - "almost detrimental". Forces himself to go out and do things. 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 Recently "hyperfocused" on his blood pressure. Having "pains" from anxiety". Was seen at the ED - "everything came back good". Stating "I feel like I'm on a carousel of worry". Reading self help books. Stable interest and motivation. Taking medications as prescribed.   Per wife - "hyperfocused" - worries about weight, heart, daughter returning to school, calling mother. Worse OCD when losing a family member. Always thinks things are wrong with him.   Energy levels stable. Active, does not have a regular exercise routine. 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 Enjoys some usual interests and activities. Married. Lives with wife of  30 years. Has 2 daughters age 106 at [place] and 38 at [place]. Spending time with family. Appetite adequate. Weight loss - 8 pounds.  Sleeps well most nights. Averages 2 to 3 hours then up and back to sleep 2 to 3 hours. Getting nervous when he first gets up.  Focus and concentration stable. Completing tasks. Managing aspects of household.  Denies SI or HI. Denies AH or VH. Seeing therapist - Vernie Murders  Previous medications: Lexapro - groggy, Wellbutrin SR 150mg   Visit Diagnosis:    ICD-10-CM   1. Generalized anxiety disorder  F41.1 FLUoxetine (PROZAC) 20 MG capsule  2. Major depressive disorder,  recurrent episode, moderate (HCC)  F33.1 FLUoxetine (PROZAC) 20 MG capsule  3. Obsessive-compulsive disorder, unspecified type  F42.9 FLUoxetine (PROZAC) 20 MG capsule  4. Insomnia, unspecified type  G47.00     Past Psychiatric History: Denies psychiatric hospitalization.  Past Medical History:  Past Medical History:  Diagnosis Date  . Anxiety   . Hypothyroidism   . IBS (irritable bowel syndrome)     Past Surgical History:  Procedure Laterality Date  . CHOLECYSTECTOMY  2007  . VASECTOMY  2000    Family Psychiatric History: Denies any family history of mental illness.  Family History:  Family History  Problem Relation Age of Onset  . Hypertension Father   . Heart attack Father   . Lung cancer Father   . Benign prostatic hyperplasia Brother   . Colon cancer Neg Hx     Social History:  Social History   Socioeconomic History  . Marital status: Married    Spouse name: [name]  . Number of children: 2  . Years of education: Not on file  . Highest education level: Not on file  Occupational History  . Not on file  Tobacco Use  . Smoking status: Never Smoker  . Smokeless tobacco: Never Used  Substance and Sexual Activity  . Alcohol use: No  . Drug use: No  . Sexual activity: Yes  Other Topics Concern  . Not on file  Social History Narrative  . Not on file   Social Determinants of Health   Financial Resource Strain:   . Difficulty of Paying Living Expenses: Not on file  Food Insecurity:   . Worried About Charity fundraiser in the Last Year: Not on file  . Ran Out of Food in the Last Year: Not on file  Transportation Needs:   . Lack of Transportation (Medical): Not on file  . Lack of Transportation (Non-Medical): Not on file  Physical Activity:   . Days of Exercise per Week: Not on file  . Minutes of Exercise per Session: Not on file  Stress:   . Feeling of Stress : Not on file  Social Connections:   . Frequency of Communication with Friends and Family: Not  on file  . Frequency of Social Gatherings with Friends and Family: Not on file  . Attends Religious Services: Not on file  . Active Member of Clubs or Organizations: Not on file  . Attends Archivist Meetings: Not on file  . Marital Status: Not on file    Allergies:  Allergies  Allergen Reactions  . Lexapro [Escitalopram Oxalate] Other (See Comments)    grogginess    Metabolic Disorder Labs: No results found for: HGBA1C, MPG No results found for: PROLACTIN Lab Results  Component Value Date   CHOL 185 10/10/2019   TRIG 177 (H) 10/10/2019   HDL 36 (L) 10/10/2019   CHOLHDL 5.1 (H) 10/10/2019   VLDL 21 11/01/2014   LDLCALC 118 (H) 10/10/2019   LDLCALC 123 (H) 10/04/2018   Lab Results  Component Value Date   TSH 2.45 12/18/2019   TSH 3.750 10/10/2019    Therapeutic Level Labs: No results found for: LITHIUM No results found for: VALPROATE No components found for:  CBMZ  Current Medications: Current Outpatient Medications  Medication Sig Dispense Refill  . esomeprazole (NEXIUM) 20 MG capsule     . buPROPion (WELLBUTRIN SR) 150 MG 12 hr tablet Take one tablet each morning for 3 days then take one tablet po BID 90 tablet 0  . FLUoxetine (PROZAC) 20 MG capsule Take 1 capsule (20 mg total) by mouth daily. 30 capsule 5  . levothyroxine (SYNTHROID) 112 MCG tablet Take 1 tablet (112 mcg total) by mouth daily. 90 tablet 3  . LORazepam (ATIVAN) 0.5 MG tablet One tab daily as needed for anxiety 24 tablet 2   No current facility-administered medications for this visit.    Medication Side Effects: none  Orders placed this visit:  No orders of the defined types were placed in this encounter.   Psychiatric Specialty Exam:  Review of Systems  There were no vitals taken for this visit.There is no height or weight on file to calculate BMI.  General Appearance: Neat and Well Groomed  Eye Contact:  Good  Speech:  Clear and Coherent and Normal Rate  Volume:  Normal   Mood:  Anxious, Depressed and Irritable  Affect:  Appropriate and Congruent  Thought Process:  Coherent and Descriptions of Associations: Intact  Orientation:  Full (Time, Place, and Person)  Thought Content: Logical   Suicidal Thoughts:  No  Homicidal Thoughts:  No  Memory:  WNL  Judgement:  Good  Insight:  Good  Psychomotor Activity:  Normal  Concentration:  Concentration: Good  Recall:  Good  Fund of Knowledge: Good  Language: Good  Assets:  Communication Skills Desire for Improvement Financial Resources/Insurance Housing Intimacy Leisure Time Physical Health Resilience Social Support Talents/Skills Transportation Vocational/Educational  ADL's:  Intact  Cognition: WNL  Prognosis:  Good   Screenings:  GAD-7     Office Visit from 11/01/2019 in [facility]  Total GAD-7 Score  11    PHQ2-9     Office Visit from 10/11/2018 in [facility] Office Visit from 09/14/2017 in [facility]  PHQ-2 Total Score  0  0      Receiving Psychotherapy: Yes   Treatment Plan/Recommendations:   Plan:  PDMP reviewed  1. Stop Wellbutrin SR 150mg  daily - started on 11/02/2019 2. Continue Ativan 0.5mg  daily prn 3. Add Prozac 20mg  daily  RTC 4 weeks  Patient advised to contact office with any questions, adverse effects, or acute worsening in signs and symptoms.     Aloha Gell, NP

## 2019-12-25 ENCOUNTER — Other Ambulatory Visit: Payer: Self-pay

## 2019-12-25 DIAGNOSIS — N281 Cyst of kidney, acquired: Secondary | ICD-10-CM

## 2019-12-26 ENCOUNTER — Telehealth: Payer: Self-pay | Admitting: Physician Assistant

## 2019-12-26 NOTE — Telephone Encounter (Signed)
Spoke with the patient. He is asking for further understanding of his CT abdomen results and the MRI that is scheduled for 01/01/20. I have told the patient we are following the radiology recommendations of "MRI abdomen with and without contrast recommended for definitive characterization" of the renal cysts. This is investigation. No mention of "suspious" or "concerning for." The purpose of further imaging to to get answers.

## 2020-01-01 ENCOUNTER — Other Ambulatory Visit: Payer: Self-pay

## 2020-01-01 ENCOUNTER — Ambulatory Visit (HOSPITAL_COMMUNITY)
Admission: RE | Admit: 2020-01-01 | Discharge: 2020-01-01 | Disposition: A | Discharge status: Home / Self Care | Payer: 59 | Source: Ambulatory Visit | Attending: Physician Assistant | Admitting: Physician Assistant

## 2020-01-01 DIAGNOSIS — N281 Cyst of kidney, acquired: Secondary | ICD-10-CM

## 2020-01-01 MED ORDER — GADOBUTROL 1 MMOL/ML IV SOLN
8.5000 mL | Freq: Once | INTRAVENOUS | Status: AC | PRN
  Administered 2020-01-01: 8.5 mL via INTRAVENOUS

## 2020-01-02 ENCOUNTER — Telehealth: Payer: Self-pay | Admitting: Physician Assistant

## 2020-01-16 ENCOUNTER — Encounter: Payer: Self-pay | Admitting: Family Medicine

## 2020-01-21 ENCOUNTER — Other Ambulatory Visit: Payer: Self-pay

## 2020-01-21 ENCOUNTER — Encounter: Payer: Self-pay | Admitting: Adult Health

## 2020-01-21 ENCOUNTER — Ambulatory Visit (INDEPENDENT_AMBULATORY_CARE_PROVIDER_SITE_OTHER): Payer: 59 | Admitting: Adult Health

## 2020-01-21 DIAGNOSIS — G4700 Insomnia, unspecified: Secondary | ICD-10-CM

## 2020-01-21 DIAGNOSIS — F429 Obsessive-compulsive disorder, unspecified: Secondary | ICD-10-CM | POA: Diagnosis not present

## 2020-01-21 DIAGNOSIS — F411 Generalized anxiety disorder: Secondary | ICD-10-CM

## 2020-01-21 DIAGNOSIS — F331 Major depressive disorder, recurrent, moderate: Secondary | ICD-10-CM

## 2020-01-21 MED ORDER — FLUOXETINE HCL 20 MG PO CAPS: 20.0000 mg | ORAL_CAPSULE | Freq: Every day | ORAL | 1 refills | Status: DC

## 2020-01-21 NOTE — Progress Notes (Signed)
XZAYVION FLAIM [id number] [birth year] 54 y.o.  Subjective:   Patient ID:  John Wu is a 54 y.o. (DOB Aug 18, 1966) male.  Chief Complaint:  Chief Complaint  Patient presents with  . Anxiety  . Depression  . Insomnia  . Other    OCD    HPI John Wu presents to the office today for follow-up of GAD, MDD, insomnia, and OCD.  Describes mood today as "ok". Pleasant. Mood symptoms - reports decreased depression, anxiety, and irritability.  Wife saw a difference in the first week. Stating "things seem to be going better". Recent CT from GI was "good". Had some cysts identified on kidney. Had a period of 7 to 8 days where he couldn't settle down. Anxiety used to get him first thing in the morning, but is now less. Going to the grocery store more - less "social anxiety". Reading some faith based books. Handling things much better. Helping mother pack up things. Handling loss of father better. Grieving, but not fixating on things. Doesn't feel like he gets hyper-focused. Stopped goggling "medications". Not as concerned over his health and family. Handling situations much better. Stable interest and motivation. Taking medications as prescribed.  Energy levels stable. Active, does not have a regular exercise routine.Coaching baseball 2 days a week.  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  Enjoys some usual interests and activities. Married. Lives with wife of  56 years. Has 2 daughters age 43 at [place] and 16 at [place]. Spending time with family.  Appetite adequate. Weight loss - 8 pounds.  Sleeps well most nights. Averages 6 to 8. Not getting up and down during the night.  Focus and concentration stable. Completing tasks. Managing aspects of household. Work going well. Denies SI or HI. Denies AH or VH.  Seeing therapist - Vernie Murders  Previous medications: Lexapro - groggy, Wellbutrin SR 150mg    GAD-7     Office Visit from 11/01/2019 in [facility]  Total GAD-7 Score  11    PHQ2-9     Office Visit from 10/11/2018 in [facility] Office Visit from 09/14/2017 in [facility]  PHQ-2 Total Score  0  0       Review of Systems:  Review of Systems  Musculoskeletal: Negative for gait problem.  Neurological: Negative for tremors.  Psychiatric/Behavioral:       Please refer to HPI    Medications: I have reviewed the patient's current medications.  Current Outpatient Medications  Medication Sig Dispense Refill  . esomeprazole (NEXIUM) 20 MG capsule     . FLUoxetine (PROZAC) 20 MG capsule Take 1 capsule (20 mg total) by mouth daily. 90 capsule 1  . levothyroxine (SYNTHROID) 112 MCG tablet Take 1 tablet (112 mcg total) by mouth daily. 90 tablet 3  . LORazepam (ATIVAN) 0.5 MG tablet One tab daily as needed for anxiety 24 tablet 2   No current facility-administered medications for this visit.    Medication Side Effects: None  Allergies:  Allergies  Allergen Reactions  . Lexapro [Escitalopram Oxalate] Other (See Comments)    grogginess    Past Medical History:  Diagnosis Date  . Anxiety   . Hypothyroidism   . IBS (irritable bowel syndrome)     Family History  Problem Relation Age of Onset  . Hypertension Father   . Heart attack Father   . Lung cancer Father   . Benign prostatic hyperplasia Brother   . Colon cancer Neg Hx  Social History   Socioeconomic History  . Marital status: Married    Spouse name: [name]  . Number of children: 2  . Years of education: Not on file  . Highest education level: Not on file  Occupational History  . Not on file  Tobacco Use  . Smoking status: Never Smoker  . Smokeless tobacco: Never Used  Substance and Sexual Activity  . Alcohol use: No  . Drug use: No  . Sexual activity: Yes  Other Topics Concern  . Not on file  Social History Narrative  . Not on file   Social Determinants of Health   Financial Resource Strain:   . Difficulty  of Paying Living Expenses: Not on file  Food Insecurity:   . Worried About Charity fundraiser in the Last Year: Not on file  . Ran Out of Food in the Last Year: Not on file  Transportation Needs:   . Lack of Transportation (Medical): Not on file  . Lack of Transportation (Non-Medical): Not on file  Physical Activity:   . Days of Exercise per Week: Not on file  . Minutes of Exercise per Session: Not on file  Stress:   . Feeling of Stress : Not on file  Social Connections:   . Frequency of Communication with Friends and Family: Not on file  . Frequency of Social Gatherings with Friends and Family: Not on file  . Attends Religious Services: Not on file  . Active Member of Clubs or Organizations: Not on file  . Attends Archivist Meetings: Not on file  . Marital Status: Not on file  Intimate Partner Violence:   . Fear of Current or Ex-Partner: Not on file  . Emotionally Abused: Not on file  . Physically Abused: Not on file  . Sexually Abused: Not on file    Past Medical History, Surgical history, Social history, and Family history were reviewed and updated as appropriate.   Please see review of systems for further details on the patient's review from today.   Objective:   Physical Exam:  There were no vitals taken for this visit.  Physical Exam Constitutional:      General: He is not in acute distress.    Appearance: He is well-developed.  Musculoskeletal:        General: No deformity.  Neurological:     Mental Status: He is alert and oriented to person, place, and time.     Coordination: Coordination normal.  Psychiatric:        Attention and Perception: Attention and perception normal. He does not perceive auditory or visual hallucinations.        Mood and Affect: Mood normal. Mood is not anxious or depressed. Affect is not labile, blunt, angry or inappropriate.        Speech: Speech normal.        Behavior: Behavior normal.        Thought Content: Thought  content normal. Thought content is not paranoid or delusional. Thought content does not include homicidal or suicidal ideation. Thought content does not include homicidal or suicidal plan.        Cognition and Memory: Cognition and memory normal.        Judgment: Judgment normal.     Comments: Insight intact     Lab Review:     Component Value Date/Time   NA 140 12/18/2019 1658   NA 142 10/10/2019 0813   K 4.3 12/18/2019 1658   CL 103 12/18/2019  1658   CO2 32 12/18/2019 1658   GLUCOSE 116 (H) 12/18/2019 1658   BUN 23 12/18/2019 1658   BUN 19 10/10/2019 0813   CREATININE 1.27 12/18/2019 1658   CREATININE 1.07 11/01/2014 0756   CALCIUM 9.9 12/18/2019 1658   PROT 6.5 12/18/2019 1658   PROT 6.0 10/10/2019 0813   ALBUMIN 4.5 12/18/2019 1658   ALBUMIN 4.2 10/10/2019 0813   AST 14 12/18/2019 1658   ALT 19 12/18/2019 1658   ALKPHOS 69 12/18/2019 1658   BILITOT 0.5 12/18/2019 1658   BILITOT 0.6 10/10/2019 0813   GFRNONAA >60 11/19/2019 2115   GFRAA >60 11/19/2019 2115       Component Value Date/Time   WBC 8.4 11/19/2019 2115   RBC 5.28 11/19/2019 2115   HGB 15.9 11/19/2019 2115   HGB 15.1 03/20/2018 1435   HCT 45.3 11/19/2019 2115   HCT 45.3 03/20/2018 1435   PLT 271 11/19/2019 2115   PLT 253 03/20/2018 1435   MCV 85.8 11/19/2019 2115   MCV 89 03/20/2018 1435   MCH 30.1 11/19/2019 2115   MCHC 35.1 11/19/2019 2115   RDW 12.3 11/19/2019 2115   RDW 13.7 03/20/2018 1435   LYMPHSABS 1.4 03/20/2018 1435   EOSABS 0.1 03/20/2018 1435   BASOSABS 0.0 03/20/2018 1435    No results found for: POCLITH, LITHIUM   No results found for: PHENYTOIN, PHENOBARB, VALPROATE, CBMZ   .res Assessment: Plan:   Plan:  PDMP reviewed  1. Stop Wellbutrin SR 150mg  daily - started on 11/02/2019 2. Continue Ativan 0.5mg  daily prn 3. Add Prozac 20mg  daily  RTC 4 weeks  Patient advised to contact office with any questions, adverse effects, or acute worsening in signs and symptoms.  John Wu  was seen today for anxiety, depression, insomnia and other.  Diagnoses and all orders for this visit:  Insomnia, unspecified type  Obsessive-compulsive disorder, unspecified type -     FLUoxetine (PROZAC) 20 MG capsule; Take 1 capsule (20 mg total) by mouth daily.  Major depressive disorder, recurrent episode, moderate (HCC) -     FLUoxetine (PROZAC) 20 MG capsule; Take 1 capsule (20 mg total) by mouth daily.  Generalized anxiety disorder -     FLUoxetine (PROZAC) 20 MG capsule; Take 1 capsule (20 mg total) by mouth daily.     Please see After Visit Summary for patient specific instructions.  Future Appointments  Date Time Provider [place]  02/18/2020  9:00 AM [name], MD [facility]  03/20/2020  1:20 PM Seeley Hissong, Berdie Ogren, NP [facility] None    No orders of the defined types were placed in this encounter.   -------------------------------

## 2020-02-18 ENCOUNTER — Other Ambulatory Visit: Payer: Self-pay

## 2020-02-18 ENCOUNTER — Ambulatory Visit (INDEPENDENT_AMBULATORY_CARE_PROVIDER_SITE_OTHER): Payer: 59 | Admitting: Family Medicine

## 2020-02-18 DIAGNOSIS — F411 Generalized anxiety disorder: Secondary | ICD-10-CM | POA: Diagnosis not present

## 2020-02-18 DIAGNOSIS — E039 Hypothyroidism, unspecified: Secondary | ICD-10-CM | POA: Diagnosis not present

## 2020-02-18 MED ORDER — LEVOTHYROXINE SODIUM 112 MCG PO TABS: 112.0000 ug | ORAL_TABLET | Freq: Every day | ORAL | 3 refills | Status: DC

## 2020-02-18 NOTE — Progress Notes (Signed)
   Subjective:  Audio only  Patient ID: John Wu, male    DOB: 10-24-1966, 54 y.o.   MRN: [id number]  HPIbp yesterday 110/68 Weight 186 lbs   Hypothyroidism.  Takes levothyroxine.  Lorazepam for anxiety. Pt states he has not had to take any in the past 6 weeks. Doing well with anxiety. Gad 7 done.   Fluoxetine prescribed by specialist.   Pt states no concerns today.   Virtual Visit via Telephone Note  I connected with John Wu on 02/18/20 at  9:00 AM EST by telephone and verified that I am speaking with the correct person using two identifiers.  Location: Patient: home Provider: office   I discussed the limitations, risks, security and privacy concerns of performing an evaluation and management service by telephone and the availability of in person appointments. I also discussed with the patient that there may be a patient responsible charge related to this service. The patient expressed understanding and agreed to proceed.   History of Present Illness:    Observations/Objective:   Assessment and Plan:   Follow Up Instructions:    I discussed the assessment and treatment plan with the patient. The patient was provided an opportunity to ask questions and all were answered. The patient agreed with the plan and demonstrated an understanding of the instructions.   The patient was advised to call back or seek an in-person evaluation if the symptoms worsen or if the condition fails to improve as anticipated.  I provided 22 minutes of non-face-to-face time during this encounter.  Patient states overall feeling quite a bit better.  No longer losing weight.  Anxiety has calmed down nicely  Seen a psychiatrist and on fluoxetine.  Definitely has helped symptoms.  Claims complete compliance with thyroid medication.  TSH done just last month within normal limits    Review of Systems No headache no chest pain no shortness of breath    Objective:   Physical Exam  Virtual      Assessment & Plan:  Impression generalized anxiety disorder with elements of depression and OCD.  Patient to maintain same medication via psychiatrist.  I really think you would benefit from long-term mental health management.  2.  Hypothyroidism medication refilled for 1 year diet exercise discussed

## 2020-03-13 ENCOUNTER — Other Ambulatory Visit: Payer: Self-pay

## 2020-03-13 DIAGNOSIS — F331 Major depressive disorder, recurrent, moderate: Secondary | ICD-10-CM

## 2020-03-13 DIAGNOSIS — F429 Obsessive-compulsive disorder, unspecified: Secondary | ICD-10-CM

## 2020-03-13 DIAGNOSIS — F411 Generalized anxiety disorder: Secondary | ICD-10-CM

## 2020-03-13 MED ORDER — FLUOXETINE HCL 20 MG PO CAPS: 20.0000 mg | ORAL_CAPSULE | Freq: Every day | ORAL | 1 refills | Status: DC

## 2020-03-18 ENCOUNTER — Other Ambulatory Visit: Payer: Self-pay | Admitting: *Deleted

## 2020-03-18 ENCOUNTER — Telehealth: Payer: Self-pay | Admitting: Family Medicine

## 2020-03-18 MED ORDER — LEVOTHYROXINE SODIUM 112 MCG PO TABS: 112.0000 ug | ORAL_TABLET | Freq: Every day | ORAL | 3 refills | Status: DC

## 2020-03-18 NOTE — Telephone Encounter (Signed)
Refills sent to [facility] per dr Richardson Landry

## 2020-03-18 NOTE — Telephone Encounter (Signed)
Pt.notified

## 2020-03-18 NOTE — Telephone Encounter (Signed)
Pt would like his levothyroxine (SYNTHROID) 112 MCG tablet transferred to [facility]

## 2020-03-20 ENCOUNTER — Ambulatory Visit (INDEPENDENT_AMBULATORY_CARE_PROVIDER_SITE_OTHER): Payer: 59 | Admitting: Adult Health

## 2020-03-20 ENCOUNTER — Other Ambulatory Visit: Payer: Self-pay

## 2020-03-20 ENCOUNTER — Encounter: Payer: Self-pay | Admitting: Adult Health

## 2020-03-20 DIAGNOSIS — F411 Generalized anxiety disorder: Secondary | ICD-10-CM | POA: Diagnosis not present

## 2020-03-20 DIAGNOSIS — F429 Obsessive-compulsive disorder, unspecified: Secondary | ICD-10-CM | POA: Diagnosis not present

## 2020-03-20 DIAGNOSIS — F331 Major depressive disorder, recurrent, moderate: Secondary | ICD-10-CM

## 2020-03-20 NOTE — Progress Notes (Signed)
John Wu [id number] 06-Sep-1966 54 y.o.  Subjective:   Patient ID:  John Wu is a 54 y.o. (DOB Jul 09, 1966) male.  Chief Complaint: No chief complaint on file.   HPI John Wu presents to the office today for follow-up of GAD, MDD, insomnia, and OCD.  Describes mood today as "ok". Pleasant. Mood symptoms - reports decreased depression, anxiety, and irritability. Denies worry and rumination. Stating "I'm doing good". Back into a normal routine. Starting baseball practice. Recently attended sports event. Has received Covid vaccine. Seeing therapist - Vernie Murders. Stable interest and motivation. Taking medications as prescribed.  Energy levels stable. Active, has a regular exercise routine. 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  Enjoys some usual interests and activities. Married. Lives with wife of  30 years. Has 2 daughters age 46 at [place] and 8 at [place]. Spending time with family.  Appetite adequate. Weight stable - 183.   Sleeps well most nights. Averages 7 to 8. Focus and concentration stable. Completing tasks. Managing aspects of household. Work going well. Denies SI or HI. Denies AH or VH.  Previous medications: Lexapro - groggy, Wellbutrin SR 150mg    GAD-7     Office Visit from 02/18/2020 in [facility] Visit from 11/01/2019 in [facility]  Total GAD-7 Score  0  11    PHQ2-9     Office Visit from 10/11/2018 in [place] Office Visit from 09/14/2017 in [place]  PHQ-2 Total Score  0  0       Review of Systems:  Review of Systems  Musculoskeletal: Negative for gait problem.  Neurological: Negative for tremors.  Psychiatric/Behavioral:       Please refer to HPI    Medications: I have reviewed the patient's current medications.  Current Outpatient Medications  Medication Sig Dispense Refill  . esomeprazole (NEXIUM) 20 MG  capsule     . FLUoxetine (PROZAC) 20 MG capsule Take 1 capsule (20 mg total) by mouth daily. 90 capsule 1  . levothyroxine (SYNTHROID) 112 MCG tablet Take 1 tablet (112 mcg total) by mouth daily. 90 tablet 3  . LORazepam (ATIVAN) 0.5 MG tablet One tab daily as needed for anxiety 24 tablet 2   No current facility-administered medications for this visit.    Medication Side Effects: None  Allergies:  Allergies  Allergen Reactions  . Lexapro [Escitalopram Oxalate] Other (See Comments)    grogginess    Past Medical History:  Diagnosis Date  . Anxiety   . Hypothyroidism   . IBS (irritable bowel syndrome)     Family History  Problem Relation Age of Onset  . Hypertension Father   . Heart attack Father   . Lung cancer Father   . Benign prostatic hyperplasia Brother   . Colon cancer Neg Hx     Social History   Socioeconomic History  . Marital status: Married    Spouse name: [name]  . Number of children: 2  . Years of education: Not on file  . Highest education level: Not on file  Occupational History  . Not on file  Tobacco Use  . Smoking status: Never Smoker  . Smokeless tobacco: Never Used  Substance and Sexual Activity  . Alcohol use: No  . Drug use: No  . Sexual activity: Yes  Other Topics Concern  . Not on file  Social History Narrative  . Not on file   Social Determinants of  Health   Financial Resource Strain:   . Difficulty of Paying Living Expenses:   Food Insecurity:   . Worried About Charity fundraiser in the Last Year:   . Arboriculturist in the Last Year:   Transportation Needs:   . Film/video editor (Medical):   Marland Kitchen Lack of Transportation (Non-Medical):   Physical Activity:   . Days of Exercise per Week:   . Minutes of Exercise per Session:   Stress:   . Feeling of Stress :   Social Connections:   . Frequency of Communication with Friends and Family:   . Frequency of Social Gatherings with Friends and Family:   . Attends Religious  Services:   . Active Member of Clubs or Organizations:   . Attends Archivist Meetings:   Marland Kitchen Marital Status:   Intimate Partner Violence:   . Fear of Current or Ex-Partner:   . Emotionally Abused:   Marland Kitchen Physically Abused:   . Sexually Abused:     Past Medical History, Surgical history, Social history, and Family history were reviewed and updated as appropriate.   Please see review of systems for further details on the patient's review from today.   Objective:   Physical Exam:  There were no vitals taken for this visit.  Physical Exam Constitutional:      General: He is not in acute distress. Musculoskeletal:        General: No deformity.  Neurological:     Mental Status: He is alert and oriented to person, place, and time.     Coordination: Coordination normal.  Psychiatric:        Attention and Perception: Attention and perception normal. He does not perceive auditory or visual hallucinations.        Mood and Affect: Mood normal. Mood is not anxious or depressed. Affect is not labile, blunt, angry or inappropriate.        Speech: Speech normal.        Behavior: Behavior normal.        Thought Content: Thought content normal. Thought content is not paranoid or delusional. Thought content does not include homicidal or suicidal ideation. Thought content does not include homicidal or suicidal plan.        Cognition and Memory: Cognition and memory normal.        Judgment: Judgment normal.     Comments: Insight intact     Lab Review:     Component Value Date/Time   NA 140 12/18/2019 1658   NA 142 10/10/2019 0813   K 4.3 12/18/2019 1658   CL 103 12/18/2019 1658   CO2 32 12/18/2019 1658   GLUCOSE 116 (H) 12/18/2019 1658   BUN 23 12/18/2019 1658   BUN 19 10/10/2019 0813   CREATININE 1.27 12/18/2019 1658   CREATININE 1.07 11/01/2014 0756   CALCIUM 9.9 12/18/2019 1658   PROT 6.5 12/18/2019 1658   PROT 6.0 10/10/2019 0813   ALBUMIN 4.5 12/18/2019 1658   ALBUMIN  4.2 10/10/2019 0813   AST 14 12/18/2019 1658   ALT 19 12/18/2019 1658   ALKPHOS 69 12/18/2019 1658   BILITOT 0.5 12/18/2019 1658   BILITOT 0.6 10/10/2019 0813   GFRNONAA >60 11/19/2019 2115   GFRAA >60 11/19/2019 2115       Component Value Date/Time   WBC 8.4 11/19/2019 2115   RBC 5.28 11/19/2019 2115   HGB 15.9 11/19/2019 2115   HGB 15.1 03/20/2018 1435   HCT 45.3 11/19/2019 2115  HCT 45.3 03/20/2018 1435   PLT 271 11/19/2019 2115   PLT 253 03/20/2018 1435   MCV 85.8 11/19/2019 2115   MCV 89 03/20/2018 1435   MCH 30.1 11/19/2019 2115   MCHC 35.1 11/19/2019 2115   RDW 12.3 11/19/2019 2115   RDW 13.7 03/20/2018 1435   LYMPHSABS 1.4 03/20/2018 1435   EOSABS 0.1 03/20/2018 1435   BASOSABS 0.0 03/20/2018 1435    No results found for: POCLITH, LITHIUM   No results found for: PHENYTOIN, PHENOBARB, VALPROATE, CBMZ   .res Assessment: Plan:    Plan:  PDMP reviewed  1. Continue Prozac 20mg  daily 2. Continue Ativan 0.5mg  daily prn  RTC 6 months  Patient advised to contact office with any questions, adverse effects, or acute worsening in signs and symptoms.  Diagnoses and all orders for this visit:  Generalized anxiety disorder  Major depressive disorder, recurrent episode, moderate (HCC)  Obsessive-compulsive disorder, unspecified type     Please see After Visit Summary for patient specific instructions.  No future appointments.  No orders of the defined types were placed in this encounter.   -------------------------------

## 2020-05-23 NOTE — Progress Notes (Signed)
CARDIOLOGY CONSULT NOTE       Patient ID: John Wu MRN: [id number] DOB/AGE: [birth year] 56 y.o.  Admit date: (Not on file) Referring Physician: Noemi [facility] Primary Physician: John Kirschner, MD Primary Cardiologist: John Wu  Reason for F/U:  Chest Pain   HPI:  54 y.o. history of hypothyroidism, IBS and anxiety seen in [facility] 11/19/19 with chest pain Pain atypical burning and right sided Not necessarily exertional Associated with anxiety Concerned that he has cancer or heart disease. On Welbutrin and sees a therapist Concerns about COVID as well Walks 2-4 miles daily with no issues R/O in ER with normal CXR and ECG  He continues to have overwhelming anxiety and somatasizes to other parts of his body  He has established with psychiatric care   I took care of his dad John Wu who past away in January 2020  Has been seeing counselor and psychiatrist and doing much better On prozac He has had no further chest pain  He is coaching baseball for John Wu Wife is pharmacist at [employer] and has daughter who is becoming one Whole family went to [place] All other systems reviewed and negative except as noted above  Past Medical History:  Diagnosis Date  . Anxiety   . Hypothyroidism   . IBS (irritable bowel syndrome)     Family History  Problem Relation Age of Onset  . Hypertension Father   . Heart attack Father   . Lung cancer Father   . Benign prostatic hyperplasia Brother   . Colon cancer Neg Hx     Social History   Socioeconomic History  . Marital status: Married    Spouse name: John Wu  . Number of children: 2  . Years of education: Not on file  . Highest education level: Not on file  Occupational History  . Not on file  Tobacco Use  . Smoking status: Never Smoker  . Smokeless tobacco: Never Used  Vaping Use  . Vaping Use: Never used  Substance and Sexual Activity  . Alcohol use: No  . Drug use: No  . Sexual activity: Yes  Other Topics Concern  . Not  on file  Social History Narrative  . Not on file   Social Determinants of Health   Financial Resource Strain:   . Difficulty of Paying Living Expenses:   Food Insecurity:   . Worried About Charity fundraiser in the Last Year:   . Arboriculturist in the Last Year:   Transportation Needs:   . Film/video editor (Medical):   Marland Kitchen Lack of Transportation (Non-Medical):   Physical Activity:   . Days of Exercise per Week:   . Minutes of Exercise per Session:   Stress:   . Feeling of Stress :   Social Connections:   . Frequency of Communication with Friends and Family:   . Frequency of Social Gatherings with Friends and Family:   . Attends Religious Services:   . Active Member of Clubs or Organizations:   . Attends Archivist Meetings:   Marland Kitchen Marital Status:   Intimate Partner Violence:   . Fear of Current or Ex-Partner:   . Emotionally Abused:   Marland Kitchen Physically Abused:   . Sexually Abused:     Past Surgical History:  Procedure Laterality Date  . CHOLECYSTECTOMY  2007  . VASECTOMY  2000      Physical Exam: Blood pressure 132/84, pulse 69, height 5\' 10"  (1.778 m), weight  191 lb 6.4 oz (86.8 kg), SpO2 99 %.    Affect appropriate Healthy:  appears stated age 54: normal Neck supple with no adenopathy JVP normal no bruits no thyromegaly Lungs clear with no wheezing and good diaphragmatic motion Heart:  S1/S2 no murmur, no rub, gallop or click PMI normal Abdomen: benighn, BS positve, no tenderness, no AAA no bruit.  No HSM or HJR Distal pulses intact with no bruits No edema Neuro non-focal Skin warm and dry No muscular weakness   Labs:   Lab Results  Component Value Date   WBC 8.4 11/19/2019   HGB 15.9 11/19/2019   HCT 45.3 11/19/2019   MCV 85.8 11/19/2019   PLT 271 11/19/2019   No results for input(s): NA, K, CL, CO2, BUN, CREATININE, CALCIUM, PROT, BILITOT, ALKPHOS, ALT, AST, GLUCOSE in the last 168 hours.  Invalid input(s): LABALBU No results found  for: CKTOTAL, CKMB, CKMBINDEX, TROPONINI  Lab Results  Component Value Date   CHOL 185 10/10/2019   CHOL 195 10/04/2018   CHOL 179 09/07/2017   Lab Results  Component Value Date   HDL 36 (L) 10/10/2019   HDL 39 (L) 10/04/2018   HDL 44 09/07/2017   Lab Results  Component Value Date   LDLCALC 118 (H) 10/10/2019   LDLCALC 123 (H) 10/04/2018   LDLCALC 111 (H) 09/07/2017   Lab Results  Component Value Date   TRIG 177 (H) 10/10/2019   TRIG 167 (H) 10/04/2018   TRIG 121 09/07/2017   Lab Results  Component Value Date   CHOLHDL 5.1 (H) 10/10/2019   CHOLHDL 5.0 10/04/2018   CHOLHDL 4.1 09/07/2017   No results found for: LDLDIRECT    Radiology: No results found.  EKG: 11/20/19 NSR normal ECG rate 78 bpm   ASSESSMENT AND PLAN:   1. Chest Pain:  Atypical normal labs ECG and CXR resolved as anxiety improved will order coronary  Calcium score given family history and to further risk stratify   2. Thyroid:  On replacement TSH normal 12/18/19   3. Anxiety: major issue causing somatic issues On Prozac and Ativan  f/u with Dr Wolfgang Phoenix and therapist   Signed: Jenkins Rouge 05/27/2020, 1:50 PM

## 2020-05-27 ENCOUNTER — Other Ambulatory Visit: Payer: Self-pay

## 2020-05-27 ENCOUNTER — Encounter: Payer: Self-pay | Admitting: Cardiovascular Disease

## 2020-05-27 ENCOUNTER — Ambulatory Visit (INDEPENDENT_AMBULATORY_CARE_PROVIDER_SITE_OTHER): Payer: 59 | Admitting: Cardiovascular Disease

## 2020-05-27 VITALS — BP 132/84 | HR 69 | Ht 70.0 in | Wt 191.4 lb

## 2020-05-27 DIAGNOSIS — R079 Chest pain, unspecified: Secondary | ICD-10-CM | POA: Diagnosis not present

## 2020-05-27 NOTE — Patient Instructions (Signed)
Medication Instructions:  Your physician recommends that you continue on your current medications as directed. Please refer to the Current Medication list given to you today.  *If you need a refill on your cardiac medications before your next appointment, please call your pharmacy*   Lab Work: None today If you have labs (blood work) drawn today and your tests are completely normal, you will receive your results only by: Marland Kitchen MyChart Message (if you have MyChart) OR . A paper copy in the mail If you have any lab test that is abnormal or we need to change your treatment, we will call you to review the results.   Testing/Procedures: Coronary calcium score to be done at the [facility] st office   Follow-Up: At [facility], you and your health needs are our priority.  As part of our continuing mission to provide you with exceptional heart care, we have created designated Provider Care Teams.  These Care Teams include your primary Cardiologist (physician) and Advanced Practice Providers (APPs -  Physician Assistants and Nurse Practitioners) who all work together to provide you with the care you need, when you need it.  We recommend signing up for the patient portal called "MyChart".  Sign up information is provided on this After Visit Summary.  MyChart is used to connect with patients for Virtual Visits (Telemedicine).  Patients are able to view lab/test results, encounter notes, upcoming appointments, etc.  Non-urgent messages can be sent to your provider as well.   To learn more about what you can do with MyChart, go to [email or web address].    Your next appointment:   12 month(s)  The format for your next appointment:   In Person  Provider:   Jenkins Rouge, MD   Other Instructions None       Thank you for choosing [facility] !

## 2020-06-13 ENCOUNTER — Ambulatory Visit (INDEPENDENT_AMBULATORY_CARE_PROVIDER_SITE_OTHER)
Admission: RE | Admit: 2020-06-13 | Discharge: 2020-06-13 | Disposition: A | Discharge status: Home / Self Care | Payer: Self-pay | Source: Ambulatory Visit | Attending: Cardiovascular Disease | Admitting: Cardiovascular Disease

## 2020-06-13 ENCOUNTER — Other Ambulatory Visit: Payer: Self-pay

## 2020-06-13 DIAGNOSIS — R079 Chest pain, unspecified: Secondary | ICD-10-CM

## 2020-06-17 ENCOUNTER — Telehealth: Payer: Self-pay

## 2020-06-17 ENCOUNTER — Telehealth: Payer: Self-pay | Admitting: Cardiovascular Disease

## 2020-06-17 DIAGNOSIS — E7800 Pure hypercholesterolemia, unspecified: Secondary | ICD-10-CM

## 2020-06-17 DIAGNOSIS — Z8249 Family history of ischemic heart disease and other diseases of the circulatory system: Secondary | ICD-10-CM

## 2020-06-17 MED ORDER — ATORVASTATIN CALCIUM 10 MG PO TABS: 10.0000 mg | ORAL_TABLET | Freq: Every day | ORAL | 3 refills | Status: DC

## 2020-06-17 NOTE — Telephone Encounter (Signed)
Yes can take crestor 10 mg daily

## 2020-06-17 NOTE — Telephone Encounter (Signed)
Per Dr. [name], Average calcium score for age, given family history and previous LDL of 115, would start low dose lipitor 10 mg daily and repeat labs in 3 months. Patient aware of results. Patient will get repeat lab work in 3 months and lipitor 10 mg was sent to patient's pharmacy of choice.

## 2020-06-17 NOTE — Telephone Encounter (Signed)
-----   Message from Josue Hector, MD sent at 06/16/2020  8:19 AM EDT ----- Average calcium score for age given family history and previous LDL of 115 would start low dose lipitor 10 mg daily and repeat labs in 3 months

## 2020-06-17 NOTE — Telephone Encounter (Signed)
Will forward to Dr. [name] to see if patient can take Crestor instead of Lipitor.

## 2020-06-17 NOTE — Telephone Encounter (Signed)
New  Message    Pt c/o medication issue:  1. Name of Medication: Lipitor 2. How are you currently taking this medication (dosage and times per day)? As written   3. Are you having a reaction (difficulty breathing--STAT)? No   4. What is your medication issue? Patient wants to know if he can take crestor instead of this medication. Please advise.

## 2020-06-18 NOTE — Telephone Encounter (Signed)
Patient stated that [facility] has already mail out lipitor, so he will give it a try. Encouraged patient to call our office if he has any issues with lipitor and we can change it to crestor 10 if he does. Patient agreed to plan.

## 2020-08-07 MED FILL — XIIDRA 5 % SOLN: 5 | 30 days supply | Qty: 60 | Fill #0

## 2020-08-27 ENCOUNTER — Other Ambulatory Visit: Payer: Self-pay | Admitting: Adult Health

## 2020-08-27 DIAGNOSIS — F429 Obsessive-compulsive disorder, unspecified: Secondary | ICD-10-CM

## 2020-08-27 DIAGNOSIS — F331 Major depressive disorder, recurrent, moderate: Secondary | ICD-10-CM

## 2020-08-27 DIAGNOSIS — F411 Generalized anxiety disorder: Secondary | ICD-10-CM

## 2020-09-15 ENCOUNTER — Telehealth: Payer: Self-pay | Admitting: Cardiovascular Disease

## 2020-09-15 NOTE — Telephone Encounter (Signed)
John Wu is calling stating he is wanting to get his labs performed at the [facility] in [place] on [address]. Please advise.

## 2020-09-15 NOTE — Telephone Encounter (Signed)
Called patient to let him know that lab work was ordered so that he could get lab work done at [facility]. Patient will have done this week.

## 2020-09-16 ENCOUNTER — Other Ambulatory Visit: Payer: Self-pay | Admitting: *Deleted

## 2020-09-16 DIAGNOSIS — E7800 Pure hypercholesterolemia, unspecified: Secondary | ICD-10-CM

## 2020-09-16 DIAGNOSIS — Z8249 Family history of ischemic heart disease and other diseases of the circulatory system: Secondary | ICD-10-CM

## 2020-09-17 LAB — LIPID PANEL
Chol/HDL Ratio: 3.3 ratio (ref 0.0–5.0)
Cholesterol, Total: 135 mg/dL (ref 100–199)
HDL: 41 mg/dL (ref >39)
LDL Chol Calc (NIH): 75 mg/dL (ref 0–99)
Triglycerides: 100 mg/dL (ref 0–149)
VLDL Cholesterol Cal: 19 mg/dL (ref 5–40)

## 2020-09-17 LAB — HEPATIC FUNCTION PANEL
ALT: 34 IU/L (ref 0–44)
AST: 23 IU/L (ref 0–40)
Albumin: 4.4 g/dL (ref 3.8–4.9)
Alkaline Phosphatase: 81 IU/L (ref 44–121)
Bilirubin Total: 0.5 mg/dL (ref 0.0–1.2)
Bilirubin, Direct: 0.13 mg/dL (ref 0.00–0.40)
Total Protein: 6.3 g/dL (ref 6.0–8.5)

## 2020-09-18 ENCOUNTER — Other Ambulatory Visit (HOSPITAL_COMMUNITY): Payer: Self-pay | Admitting: Internal Medicine

## 2020-09-18 ENCOUNTER — Encounter: Payer: Self-pay | Admitting: Adult Health

## 2020-09-18 ENCOUNTER — Other Ambulatory Visit: Payer: Self-pay

## 2020-09-18 ENCOUNTER — Ambulatory Visit (INDEPENDENT_AMBULATORY_CARE_PROVIDER_SITE_OTHER): Payer: 59 | Admitting: Adult Health

## 2020-09-18 DIAGNOSIS — F411 Generalized anxiety disorder: Secondary | ICD-10-CM

## 2020-09-18 DIAGNOSIS — F331 Major depressive disorder, recurrent, moderate: Secondary | ICD-10-CM

## 2020-09-18 DIAGNOSIS — F429 Obsessive-compulsive disorder, unspecified: Secondary | ICD-10-CM

## 2020-09-18 MED ORDER — FLUOXETINE HCL 20 MG PO CAPS: 20.0000 mg | ORAL_CAPSULE | Freq: Every day | ORAL | 3 refills | Status: DC

## 2020-09-18 MED FILL — FLUARIX QUADRIVALENT 0.5 ML: 0.5 | 1 days supply | Qty: 1 | Fill #0

## 2020-09-18 NOTE — Progress Notes (Signed)
John Wu [id number] 1966/02/16 54 y.o.  Subjective:   Patient ID:  John Wu is a 54 y.o. (DOB [birth year]) male.  Chief Complaint: No chief complaint on file.   HPI   John Wu presents to the office today for follow-up of GAD, MDD, insomnia, and OCD.  Describes mood today as "ok". Pleasant. Mood symptoms - denies depression, anxiety, and irritability. Denies worry and rumination. Stating "everything is going good". Feels like medication continues to work well. Therapist- Vernie Murders. Stable interest and motivation. Taking medications as prescribed.  Energy levels stable. Active, has a regular exercise routine.  Enjoys some usual interests and activities. Married. Lives with wife of  30 years. Has 2 daughters age 50 and 65. Spending time with family.  Appetite adequate. Weight stable - 185 pounds.   Sleeps well most nights. Averages 7 to 8 hours . Focus and concentration stable. Completing tasks. Managing aspects of household. Work going well. 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  Denies SI or HI. Denies AH or VH.  Previous medications: Lexapro - groggy, Wellbutrin SR 150mg    GAD-7     Office Visit from 02/18/2020 in [place] Visit from 11/01/2019 in [place]  Total GAD-7 Score 0 11    PHQ2-9     Office Visit from 10/11/2018 in [place] Office Visit from 09/14/2017 in [place]  PHQ-2 Total Score 0 0       Review of Systems:  Review of Systems  Musculoskeletal: Negative for gait problem.  Neurological: Negative for tremors.  Psychiatric/Behavioral:       Please refer to HPI    Medications: I have reviewed the patient's current medications.  Current Outpatient Medications  Medication Sig Dispense Refill  . atorvastatin (LIPITOR) 10 MG tablet Take 1 tablet (10 mg total) by mouth daily. 90 tablet 3  . FLUoxetine (PROZAC) 20 MG capsule Take 1 capsule (20  mg total) by mouth daily. 90 capsule 3  . levothyroxine (SYNTHROID) 112 MCG tablet Take 1 tablet (112 mcg total) by mouth daily. 90 tablet 3   No current facility-administered medications for this visit.    Medication Side Effects: None  Allergies:  Allergies  Allergen Reactions  . Lexapro [Escitalopram Oxalate] Other (See Comments)    grogginess    Past Medical History:  Diagnosis Date  . Anxiety   . Hypothyroidism   . IBS (irritable bowel syndrome)     Family History  Problem Relation Age of Onset  . Hypertension Father   . Heart attack Father   . Lung cancer Father   . Benign prostatic hyperplasia Brother   . Colon cancer Neg Hx     Social History   Socioeconomic History  . Marital status: Married    Spouse name: [name]  . Number of children: 2  . Years of education: Not on file  . Highest education level: Not on file  Occupational History  . Not on file  Tobacco Use  . Smoking status: Never Smoker  . Smokeless tobacco: Never Used  Vaping Use  . Vaping Use: Never used  Substance and Sexual Activity  . Alcohol use: No  . Drug use: No  . Sexual activity: Yes  Other Topics Concern  . Not on file  Social History Narrative  . Not on file   Social Determinants of Health   Financial Resource Strain:   . Difficulty of Paying Living Expenses: Not on file  Food Insecurity:   . Worried About Charity fundraiser in the Last Year: Not on file  . Ran Out of Food in the Last Year: Not on file  Transportation Needs:   . Lack of Transportation (Medical): Not on file  . Lack of Transportation (Non-Medical): Not on file  Physical Activity:   . Days of Exercise per Week: Not on file  . Minutes of Exercise per Session: Not on file  Stress:   . Feeling of Stress : Not on file  Social Connections:   . Frequency of Communication with Friends and Family: Not on file  . Frequency of Social Gatherings with Friends and Family: Not on file  . Attends Religious Services:  Not on file  . Active Member of Clubs or Organizations: Not on file  . Attends Archivist Meetings: Not on file  . Marital Status: Not on file  Intimate Partner Violence:   . Fear of Current or Ex-Partner: Not on file  . Emotionally Abused: Not on file  . Physically Abused: Not on file  . Sexually Abused: Not on file    Past Medical History, Surgical history, Social history, and Family history were reviewed and updated as appropriate.   Please see review of systems for further details on the patient's review from today.   Objective:   Physical Exam:  There were no vitals taken for this visit.  Physical Exam Constitutional:      General: He is not in acute distress. Musculoskeletal:        General: No deformity.  Neurological:     Mental Status: He is alert and oriented to person, place, and time.     Cranial Nerves: No dysarthria.     Coordination: Coordination normal.  Psychiatric:        Attention and Perception: Attention and perception normal. He does not perceive auditory or visual hallucinations.        Mood and Affect: Mood normal. Mood is not anxious or depressed. Affect is not labile, blunt, angry or inappropriate.        Speech: Speech normal.        Behavior: Behavior normal. Behavior is cooperative.        Thought Content: Thought content normal. Thought content is not paranoid or delusional. Thought content does not include homicidal or suicidal ideation. Thought content does not include homicidal or suicidal plan.        Cognition and Memory: Cognition and memory normal.        Judgment: Judgment normal.     Comments: Insight intact     Lab Review:     Component Value Date/Time   NA 140 12/18/2019 1658   NA 142 10/10/2019 0813   K 4.3 12/18/2019 1658   CL 103 12/18/2019 1658   CO2 32 12/18/2019 1658   GLUCOSE 116 (H) 12/18/2019 1658   BUN 23 12/18/2019 1658   BUN 19 10/10/2019 0813   CREATININE 1.27 12/18/2019 1658   CREATININE 1.07  11/01/2014 0756   CALCIUM 9.9 12/18/2019 1658   PROT 6.3 09/16/2020 0833   ALBUMIN 4.4 09/16/2020 0833   AST 23 09/16/2020 0833   ALT 34 09/16/2020 0833   ALKPHOS 81 09/16/2020 0833   BILITOT 0.5 09/16/2020 0833   GFRNONAA >60 11/19/2019 2115   GFRAA >60 11/19/2019 2115       Component Value Date/Time   WBC 8.4 11/19/2019 2115   RBC 5.28 11/19/2019 2115   HGB 15.9 11/19/2019 2115  HGB 15.1 03/20/2018 1435   HCT 45.3 11/19/2019 2115   HCT 45.3 03/20/2018 1435   PLT 271 11/19/2019 2115   PLT 253 03/20/2018 1435   MCV 85.8 11/19/2019 2115   MCV 89 03/20/2018 1435   MCH 30.1 11/19/2019 2115   MCHC 35.1 11/19/2019 2115   RDW 12.3 11/19/2019 2115   RDW 13.7 03/20/2018 1435   LYMPHSABS 1.4 03/20/2018 1435   EOSABS 0.1 03/20/2018 1435   BASOSABS 0.0 03/20/2018 1435    No results found for: POCLITH, LITHIUM   No results found for: PHENYTOIN, PHENOBARB, VALPROATE, CBMZ   .res Assessment: Plan:    Plan:  PDMP reviewed  1. Continue Prozac 20mg  daily 2. Continue Ativan 0.5mg  daily prn  RTC 6 months  Patient advised to contact office with any questions, adverse effects, or acute worsening in signs and symptoms.  Diagnoses and all orders for this visit:  Obsessive-compulsive disorder, unspecified type -     FLUoxetine (PROZAC) 20 MG capsule; Take 1 capsule (20 mg total) by mouth daily.  Major depressive disorder, recurrent episode, moderate (HCC) -     FLUoxetine (PROZAC) 20 MG capsule; Take 1 capsule (20 mg total) by mouth daily.  Generalized anxiety disorder -     FLUoxetine (PROZAC) 20 MG capsule; Take 1 capsule (20 mg total) by mouth daily.     Please see After Visit Summary for patient specific instructions.  No future appointments.  No orders of the defined types were placed in this encounter.   -------------------------------

## 2020-12-26 ENCOUNTER — Telehealth: Payer: Self-pay

## 2020-12-26 ENCOUNTER — Other Ambulatory Visit: Payer: Self-pay | Admitting: *Deleted

## 2020-12-26 DIAGNOSIS — Z13220 Encounter for screening for lipoid disorders: Secondary | ICD-10-CM

## 2020-12-26 DIAGNOSIS — E785 Hyperlipidemia, unspecified: Secondary | ICD-10-CM

## 2020-12-26 DIAGNOSIS — R5383 Other fatigue: Secondary | ICD-10-CM

## 2020-12-26 DIAGNOSIS — Z125 Encounter for screening for malignant neoplasm of prostate: Secondary | ICD-10-CM

## 2020-12-26 DIAGNOSIS — E039 Hypothyroidism, unspecified: Secondary | ICD-10-CM

## 2020-12-26 DIAGNOSIS — Z79899 Other long term (current) drug therapy: Secondary | ICD-10-CM

## 2020-12-26 NOTE — Telephone Encounter (Signed)
Labs ordered.

## 2020-12-26 NOTE — Telephone Encounter (Signed)
Patient has an appt on 01-22-21 and would like to have his bloodwork done in advance at [facility]. Needs orders put in.  Patient knows to go a week in advance and to be fasting.

## 2021-01-04 ENCOUNTER — Other Ambulatory Visit: Payer: Self-pay

## 2021-01-04 ENCOUNTER — Encounter: Payer: Self-pay | Admitting: Emergency Medicine

## 2021-01-04 ENCOUNTER — Ambulatory Visit
Admission: EM | Admit: 2021-01-04 | Discharge: 2021-01-04 | Disposition: A | Discharge status: Home / Self Care | Payer: 59 | Attending: Family Medicine | Admitting: Family Medicine

## 2021-01-04 DIAGNOSIS — R350 Frequency of micturition: Secondary | ICD-10-CM | POA: Diagnosis present

## 2021-01-04 DIAGNOSIS — R300 Dysuria: Secondary | ICD-10-CM

## 2021-01-04 DIAGNOSIS — N50819 Testicular pain, unspecified: Secondary | ICD-10-CM

## 2021-01-04 LAB — POCT URINALYSIS DIP (MANUAL ENTRY)
Bilirubin, UA: NEGATIVE
Blood, UA: NEGATIVE
Glucose, UA: NEGATIVE mg/dL
Leukocytes, UA: NEGATIVE
Nitrite, UA: NEGATIVE
Protein Ur, POC: NEGATIVE mg/dL
Spec Grav, UA: 1.025 (ref 1.010–1.025)
Urobilinogen, UA: 1 E.U./dL
pH, UA: 7 (ref 5.0–8.0)

## 2021-01-04 MED ORDER — CIPROFLOXACIN HCL 500 MG PO TABS: 500.0000 mg | ORAL_TABLET | Freq: Two times a day (BID) | ORAL | 0 refills | Status: DC

## 2021-01-04 NOTE — Discharge Instructions (Signed)
I have sent in Cipro for you to take twice a day for 7 days  You may have a urinary tract infection.   We are going to culture your urine and will call you as soon as we have the results.   Drink plenty of water, 8-10 glasses per day.   You may take AZO over the counter for painful urination.  Follow up with your primary care provider as needed.   Go to the Emergency Department if you experience severe pain, shortness of breath, high fever, or other concerns.   

## 2021-01-04 NOTE — ED Triage Notes (Signed)
burning on urination and frequency x 2 weeks

## 2021-01-05 LAB — URINE CULTURE: Culture: NO GROWTH

## 2021-01-07 NOTE — ED Provider Notes (Signed)
[facility]   [id number] 01/04/21 Arrival Time: 3016   CC: CONCERN FOR STD  SUBJECTIVE:  John Wu is a 55 y.o. male who presents with a pulling and tugging sensation to the right testicle.  Reports that he is having some burning with urination and urinary frequency for the last 2 weeks.  Patient reports that he also has high anxiety, and has been googling what is wrong with him.  Reports that there is testicular tenderness to the right testicle to the posterior aspect.  Has not attempted to treat this at home.  Has not had the symptoms before.  He reports that sometimes this can lead to physical manifestation of symptoms.  Denies fever, chills, nausea, vomiting, abdominal or pelvic pain, penile rashes or lesions, testicular swelling or pain.     ROS: As per HPI.  All other pertinent ROS negative.     Past Medical History:  Diagnosis Date  . Anxiety   . Hypothyroidism   . IBS (irritable bowel syndrome)    Past Surgical History:  Procedure Laterality Date  . CHOLECYSTECTOMY  2007  . VASECTOMY  2000   Allergies  Allergen Reactions  . Lexapro [Escitalopram Oxalate] Other (See Comments)    grogginess   No current facility-administered medications on file prior to encounter.   Current Outpatient Medications on File Prior to Encounter  Medication Sig Dispense Refill  . atorvastatin (LIPITOR) 10 MG tablet Take 1 tablet (10 mg total) by mouth daily. 90 tablet 3  . FLUoxetine (PROZAC) 20 MG capsule Take 1 capsule (20 mg total) by mouth daily. 90 capsule 3  . levothyroxine (SYNTHROID) 112 MCG tablet Take 1 tablet (112 mcg total) by mouth daily. 90 tablet 3   Social History   Socioeconomic History  . Marital status: Married    Spouse name: [name]  . Number of children: 2  . Years of education: Not on file  . Highest education level: Not on file  Occupational History  . Not on file  Tobacco Use  . Smoking status: Never Smoker  . Smokeless tobacco: Never Used   Vaping Use  . Vaping Use: Never used  Substance and Sexual Activity  . Alcohol use: No  . Drug use: No  . Sexual activity: Yes  Other Topics Concern  . Not on file  Social History Narrative  . Not on file   Social Determinants of Health   Financial Resource Strain: Not on file  Food Insecurity: Not on file  Transportation Needs: Not on file  Physical Activity: Not on file  Stress: Not on file  Social Connections: Not on file  Intimate Partner Violence: Not on file   Family History  Problem Relation Age of Onset  . Hypertension Father   . Heart attack Father   . Lung cancer Father   . Benign prostatic hyperplasia Brother   . Colon cancer Neg Hx     OBJECTIVE:  Vitals:   01/04/21 1343 01/04/21 1344  BP: 137/84   Pulse: 90   Resp: 18   Temp: 98.5 F (36.9 C)   TempSrc: Oral   SpO2: 98%   Weight:  185 lb (83.9 kg)  Height:  5\' 10"  (1.778 m)     General appearance: alert, NAD, appears stated age Head: NCAT Throat: lips, mucosa, and tongue normal; teeth and gums normal Lungs: CTA bilaterally without adventitious breath sounds Heart: regular rate and rhythm.  Radial pulses 2+ symmetrical bilaterally Back: no CVA tenderness Abdomen: soft, non-tender;  bowel sounds normal; no masses or organomegaly; no guarding or rebound tenderness GU: deferred Skin: warm and dry Psychological:  Alert and cooperative. Normal mood and affect.  LABS:  Results for orders placed or performed during the hospital encounter of 01/04/21  Urine Culture   Specimen: Urine, Random  Result Value Ref Range   Specimen Description      URINE, RANDOM Performed at [facility] [address] [place], [place] [address]    Special Requests      NONE Performed at [facility], [address], [place], [place] [address]    Culture      NO GROWTH Performed at [facility] [address] [place], [place] [address]    Report Status 01/05/2021 FINAL   POCT urinalysis dipstick   Result Value Ref Range   Color, UA yellow yellow   Clarity, UA clear clear   Glucose, UA negative negative mg/dL   Bilirubin, UA negative negative   Ketones, POC UA trace (5) (A) negative mg/dL   Spec Grav, UA 1.025 1.010 - 1.025   Blood, UA negative negative   pH, UA 7.0 5.0 - 8.0   Protein Ur, POC negative negative mg/dL   Urobilinogen, UA 1.0 0.2 or 1.0 E.U./dL   Nitrite, UA Negative Negative   Leukocytes, UA Negative Negative    Labs Reviewed  POCT URINALYSIS DIP (MANUAL ENTRY) - Abnormal; Notable for the following components:      Result Value   Ketones, POC UA trace (5) (*)    All other components within normal limits  URINE CULTURE    ASSESSMENT & PLAN:  1. Testicular discomfort   2. Dysuria   3. Urinary frequency     Meds ordered this encounter  Medications  . ciprofloxacin (CIPRO) 500 MG tablet    Sig: Take 1 tablet (500 mg total) by mouth every 12 (twelve) hours.    Dispense:  10 tablet    Refill:  0    Order Specific Question:   Supervising Provider    Answer:   Chase Picket [[id number]]    Pending: Labs Reviewed  POCT URINALYSIS DIP (MANUAL ENTRY) - Abnormal; Notable for the following components:      Result Value   Ketones, POC UA trace (5) (*)    All other components within normal limits  URINE CULTURE   UA not concerning for infection today Possible epididymitis Cipro prescribed 500 mg twice daily x5 days If symptoms are not improving, follow-up with this office or with primary care  Take medications as prescribed and to completion We will follow up with you regarding the results of your test If tests are positive, please abstain from sexual activity until you and your partner(s) are treated Follow up with PCP or [facility] if symptoms persists Return here or go to ER if you have any new or worsening symptoms    Reviewed expectations re: course of current medical issues. Questions answered. Outlined signs and symptoms indicating  need for more acute intervention. Patient verbalized understanding. After Visit Summary given.       Faustino Congress, NP 01/07/21 [phone]

## 2021-01-16 ENCOUNTER — Telehealth: Payer: Self-pay

## 2021-01-16 LAB — HEPATIC FUNCTION PANEL: Bilirubin, Direct: 0.26 mg/dL (ref 0.00–0.40)

## 2021-01-16 LAB — LIPID PANEL
Chol/HDL Ratio: 2.7 ratio (ref 0.0–5.0)
Cholesterol, Total: 127 mg/dL (ref 100–199)
HDL: 47 mg/dL (ref >39)
LDL Chol Calc (NIH): 64 mg/dL (ref 0–99)
Triglycerides: 78 mg/dL (ref 0–149)
VLDL Cholesterol Cal: 16 mg/dL (ref 5–40)

## 2021-01-16 LAB — CBC WITH DIFFERENTIAL/PLATELET
Basophils Absolute: 0.1 10*3/uL (ref 0.0–0.2)
Basos: 1 %
EOS (ABSOLUTE): 0.8 10*3/uL — ABNORMAL HIGH (ref 0.0–0.4)
Eos: 11 %
Hematocrit: 45.8 % (ref 37.5–51.0)
Hemoglobin: 15.6 g/dL (ref 13.0–17.7)
Immature Grans (Abs): 0 10*3/uL (ref 0.0–0.1)
Immature Granulocytes: 0 %
Lymphocytes Absolute: 1.8 10*3/uL (ref 0.7–3.1)
Lymphs: 25 %
MCH: 30.1 pg (ref 26.6–33.0)
MCHC: 34.1 g/dL (ref 31.5–35.7)
MCV: 88 fL (ref 79–97)
Monocytes Absolute: 0.5 10*3/uL (ref 0.1–0.9)
Monocytes: 6 %
Neutrophils Absolute: 4.1 10*3/uL (ref 1.4–7.0)
Neutrophils: 57 %
Platelets: 251 10*3/uL (ref 150–450)
RBC: 5.19 x10E6/uL (ref 4.14–5.80)
RDW: 12.6 % (ref 11.6–15.4)
WBC: 7.3 10*3/uL (ref 3.4–10.8)

## 2021-01-16 LAB — CMP14+EGFR
ALT: 31 IU/L (ref 0–44)
AST: 20 IU/L (ref 0–40)
Albumin/Globulin Ratio: 2.9 — ABNORMAL HIGH (ref 1.2–2.2)
Albumin: 4.7 g/dL (ref 3.8–4.9)
Alkaline Phosphatase: 72 IU/L (ref 44–121)
BUN/Creatinine Ratio: 17 (ref 9–20)
BUN: 19 mg/dL (ref 6–24)
Bilirubin Total: 0.8 mg/dL (ref 0.0–1.2)
CO2: 25 mmol/L (ref 20–29)
Calcium: 9.8 mg/dL (ref 8.7–10.2)
Chloride: 103 mmol/L (ref 96–106)
Creatinine, Ser: 1.13 mg/dL (ref 0.76–1.27)
GFR calc Af Amer: 84 mL/min/{1.73_m2} (ref >59)
GFR calc non Af Amer: 72 mL/min/{1.73_m2} (ref >59)
Globulin, Total: 1.6 g/dL (ref 1.5–4.5)
Glucose: 104 mg/dL — ABNORMAL HIGH (ref 65–99)
Potassium: 4.2 mmol/L (ref 3.5–5.2)
Sodium: 143 mmol/L (ref 134–144)
Total Protein: 6.3 g/dL (ref 6.0–8.5)

## 2021-01-16 LAB — TSH: TSH: 3.63 u[IU]/mL (ref 0.450–4.500)

## 2021-01-16 LAB — PSA: Prostate Specific Ag, Serum: 1 ng/mL (ref 0.0–4.0)

## 2021-01-16 NOTE — Telephone Encounter (Signed)
See labs in Epic- patient reviewed labs thru my chart and is concerned if results can wait till appt next week- he saw several things including glucose elevated Please advise

## 2021-01-16 NOTE — Telephone Encounter (Signed)
Pt had blood work done and the EOF number was high and he was worried wants a nurse to call him about this is this something that can wait till his Phy next week with Dr Lovena Le?  Pt call back [phone]

## 2021-01-16 NOTE — Telephone Encounter (Signed)
Yes, safe to review on visit.  Over all not worried about the EOS number.   Thx.   Dr. Lovena Le

## 2021-01-16 NOTE — Telephone Encounter (Signed)
Results discussed with patient. Patient advised per Dr Lovena Le:     Yes, safe to review on visit.  Over all not worried about the EOS number.     Patient verbalized understanding

## 2021-01-22 ENCOUNTER — Encounter: Payer: Self-pay | Admitting: Family Medicine

## 2021-01-22 ENCOUNTER — Ambulatory Visit (INDEPENDENT_AMBULATORY_CARE_PROVIDER_SITE_OTHER): Payer: 59 | Admitting: Family Medicine

## 2021-01-22 VITALS — BP 131/78 | HR 93 | Temp 97.9°F | Ht 68.0 in | Wt 187.8 lb

## 2021-01-22 DIAGNOSIS — E039 Hypothyroidism, unspecified: Secondary | ICD-10-CM | POA: Diagnosis not present

## 2021-01-22 DIAGNOSIS — R350 Frequency of micturition: Secondary | ICD-10-CM | POA: Diagnosis not present

## 2021-01-22 DIAGNOSIS — Z0000 Encounter for general adult medical examination without abnormal findings: Secondary | ICD-10-CM | POA: Diagnosis not present

## 2021-01-22 DIAGNOSIS — F411 Generalized anxiety disorder: Secondary | ICD-10-CM | POA: Diagnosis not present

## 2021-01-22 DIAGNOSIS — R1030 Lower abdominal pain, unspecified: Secondary | ICD-10-CM

## 2021-01-22 MED ORDER — LEVOTHYROXINE SODIUM 112 MCG PO TABS: 112.0000 ug | ORAL_TABLET | Freq: Every day | ORAL | 3 refills | Status: DC
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
## 2021-02-08 ENCOUNTER — Encounter: Payer: Self-pay | Admitting: Family Medicine

## 2021-02-08 NOTE — Addendum Note (Signed)
Addended by: Erven Colla on: 02/08/2021 02:34 PM   Modules accepted: Level of Service

## 2021-02-25 ENCOUNTER — Ambulatory Visit (INDEPENDENT_AMBULATORY_CARE_PROVIDER_SITE_OTHER): Payer: 59 | Admitting: Family Medicine

## 2021-02-25 ENCOUNTER — Other Ambulatory Visit: Payer: Self-pay

## 2021-02-25 ENCOUNTER — Encounter: Payer: Self-pay | Admitting: Family Medicine

## 2021-02-25 VITALS — BP 128/76 | HR 92 | Temp 97.6°F | Ht 68.0 in | Wt 187.0 lb

## 2021-02-25 DIAGNOSIS — R350 Frequency of micturition: Secondary | ICD-10-CM | POA: Diagnosis not present

## 2021-02-25 NOTE — Progress Notes (Signed)
Patient ID: John Wu, male    DOB: Dec 29, 1965, 55 y.o.   MRN: [id number]   Chief Complaint  Patient presents with  . Dysuria   Subjective:  CC: urinary burning, frequency, soreness  This is a new problem.  Presents today with complaints of urinary burning, frequency and soreness.  Was initially seen by urgent care on January 04, 2021 for possible epididymitis.  Treated with Cipro, reports that symptoms improved some, however returned.  Patient does admit that he suffers from anxiety, sees a therapist and psychiatrist every 2 to 3 weeks.  Last physical exam was February 2022 with PCP, Dr. Lovena Wu.  PSA at that time was 1.0 which has been consistently the same.  He is concerned that he has a serious issue, with testicles, hernia, prostate, epididymitis.  He is present today with his wife.  Denies fever, chills, chest pain, shortness of breath.  Positive for abdominal pain, possible scrotal swelling and testicular pain.  Reports that he had his wife examined his scrotum last night, for redness.   frequent urination and some burning off and on since January. Went to urgent care on 01/04/21 for urinary frequency and diagnosed with possible epididymitis.   Results for orders placed or performed during the hospital encounter of 01/04/21  Urine Culture   Specimen: Urine, Random  Result Value Ref Range   Specimen Description      URINE, RANDOM Performed at [facility] [address], [place], [place] [address]    Special Requests      NONE Performed at [facility], [address] [place], [place] [address]    Culture      NO GROWTH Performed at [facility] [address] [place], [place] [address]    Report Status 01/05/2021 FINAL   POCT urinalysis dipstick  Result Value Ref Range   Color, UA yellow yellow   Clarity, UA clear clear   Glucose, UA negative negative mg/dL   Bilirubin, UA negative negative   Ketones, POC UA trace (5) (A) negative mg/dL   Spec  Grav, UA 1.025 1.010 - 1.025   Blood, UA negative negative   pH, UA 7.0 5.0 - 8.0   Protein Ur, POC negative negative mg/dL   Urobilinogen, UA 1.0 0.2 or 1.0 E.U./dL   Nitrite, UA Negative Negative   Leukocytes, UA Negative Negative     Medical History John Wu has a past medical history of Anxiety, Hypothyroidism, and IBS (irritable bowel syndrome).   Outpatient Encounter Medications as of 02/25/2021  Medication Sig  . atorvastatin (LIPITOR) 10 MG tablet Take 1 tablet (10 mg total) by mouth daily.  Marland Kitchen FLUoxetine (PROZAC) 20 MG capsule Take 1 capsule (20 mg total) by mouth daily.  Marland Kitchen levothyroxine (SYNTHROID) 112 MCG tablet Take 1 tablet (112 mcg total) by mouth daily.  Marland Kitchen OVER THE COUNTER MEDICATION Fish oil 3 daily Xidra eye drops bid   No facility-administered encounter medications on file as of 02/25/2021.     Review of Systems  Constitutional: Negative for chills and fever.  Respiratory: Negative for shortness of breath.   Cardiovascular: Negative for chest pain.  Gastrointestinal: Positive for abdominal pain.  Genitourinary: Positive for scrotal swelling and testicular pain. Negative for hematuria.     Vitals BP 128/76   Pulse 92   Temp 97.6 F (36.4 C)   Ht 5\' 8"  (1.727 m)   Wt 187 lb (84.8 kg)   SpO2 95%   BMI 28.43 kg/m  Objective:   Physical Exam Vitals reviewed. Exam conducted with a chaperone present.  Cardiovascular:     Rate and Rhythm: Normal rate and regular rhythm.     Heart sounds: Normal heart sounds.  Pulmonary:     Effort: Pulmonary effort is normal.     Breath sounds: Normal breath sounds.  Abdominal:     Hernia: There is no hernia in the left inguinal area or right inguinal area.  Genitourinary:    Penis: Normal.      Testes: Normal.     Epididymis:     Right: Normal.     Left: Normal.     Comments: Testicular exam normal bilaterally. Scrotal exam normal bilaterally. Epididymis exam normal bilaterally. Prostate exam performed by Dr.  Sallee Wu, reports normal. Skin:    General: Skin is warm and dry.  Neurological:     General: No focal deficit present.     Mental Status: He is alert.  Psychiatric:        Behavior: Behavior normal.      Assessment and Plan   1. Urinary frequency - POCT urinalysis dipstick   John Wu was consulted and performed physical exam.  Urine dip negative for infection.  Physical exam within normal limits today.  No concerning findings.  Discussed fluoxetine dose  increase, encouraged discussion with psychiatrist as this is managed by psychiatry.   Agrees with plan of care discussed today. Understands warning signs to seek further care: chest pain, shortness of breath, any significant change in health.  Understands to follow-up in 4 weeks with John Wu for physical exam, sooner if anything changes.    John Ades, NP 02/25/2021

## 2021-02-26 LAB — POCT URINALYSIS DIPSTICK

## 2021-02-27 LAB — URINE CULTURE: Organism ID, Bacteria: NO GROWTH

## 2021-03-02 ENCOUNTER — Telehealth: Payer: Self-pay

## 2021-03-02 NOTE — Telephone Encounter (Signed)
Results discussed with patient. Patient advised per [name] NP: Urine culture without bacterial growth (normal). Patient verbalized understanding

## 2021-03-02 NOTE — Telephone Encounter (Signed)
Pt returning nurse phone call   Pt call back [phone]

## 2021-03-19 ENCOUNTER — Ambulatory Visit (INDEPENDENT_AMBULATORY_CARE_PROVIDER_SITE_OTHER): Payer: 59 | Admitting: Adult Health

## 2021-03-19 ENCOUNTER — Other Ambulatory Visit: Payer: Self-pay

## 2021-03-19 ENCOUNTER — Other Ambulatory Visit (HOSPITAL_COMMUNITY): Payer: Self-pay

## 2021-03-19 ENCOUNTER — Encounter: Payer: Self-pay | Admitting: Adult Health

## 2021-03-19 ENCOUNTER — Ambulatory Visit (INDEPENDENT_AMBULATORY_CARE_PROVIDER_SITE_OTHER): Payer: 59 | Admitting: Family Medicine

## 2021-03-19 ENCOUNTER — Encounter: Payer: Self-pay | Admitting: Family Medicine

## 2021-03-19 VITALS — BP 132/78 | Temp 97.3°F | Wt 190.6 lb

## 2021-03-19 DIAGNOSIS — F331 Major depressive disorder, recurrent, moderate: Secondary | ICD-10-CM

## 2021-03-19 DIAGNOSIS — F429 Obsessive-compulsive disorder, unspecified: Secondary | ICD-10-CM | POA: Diagnosis not present

## 2021-03-19 DIAGNOSIS — F411 Generalized anxiety disorder: Secondary | ICD-10-CM | POA: Diagnosis not present

## 2021-03-19 DIAGNOSIS — R350 Frequency of micturition: Secondary | ICD-10-CM | POA: Diagnosis not present

## 2021-03-19 LAB — POCT URINALYSIS DIPSTICK
Spec Grav, UA: 1.01 (ref 1.010–1.025)
pH, UA: 6 (ref 5.0–8.0)

## 2021-03-19 MED ORDER — FLUOXETINE HCL 10 MG PO CAPS
10.0000 mg | ORAL_CAPSULE | Freq: Every day | ORAL | 3 refills | Status: DC
Start: 2021-03-19 — End: 2021-11-02

## 2021-03-19 MED ORDER — FLUOXETINE HCL 20 MG PO CAPS
20.0000 mg | ORAL_CAPSULE | Freq: Every day | ORAL | 3 refills | Status: DC
Start: 2021-03-19 — End: 2022-03-22

## 2021-03-19 MED ORDER — FLUOXETINE HCL 10 MG PO CAPS
10.0000 mg | ORAL_CAPSULE | Freq: Every day | ORAL | 0 refills | Status: DC
Start: 2021-03-19 — End: 2021-05-27
  Filled 2021-03-19: qty 30, 30d supply, fill #0

## 2021-03-19 NOTE — Progress Notes (Signed)
   Subjective:    Patient ID: John Wu, male    DOB: [birth year], 55 y.o.   MRN: [id number]  Urinary Tract Infection    Pt following up on UTI from 4 weeks ago. Pt is still having some soreness and burning. Soreness in upper groin area. Does have some frequency at times.  Patient presents for follow-up having some intermittent soreness in the both inguinal regions left and right.  Occasional burning when he urinates but nothing severe no true dysuria no hematuria no bowel movement issues.  Please see previous note. Review of Systems See above    Objective:   Physical Exam Lungs clear respiratory rate normal heart regular pulse normal abdomen is soft no masses felt GU normal testicular exam groin area bilateral normal       Assessment & Plan:  Urine dipstick negative Reassurance given Follow-up if progressive troubles Recheck in 3 months I do not recommend any type of scan x-rays or lab work currently

## 2021-03-19 NOTE — Progress Notes (Signed)
John Wu [id number] 03/01/1966 55 y.o.  Subjective:   Patient ID:  John Wu is a 55 y.o. (DOB [birth year]) male.  Chief Complaint: No chief complaint on file.   HPI John Wu presents to the office today for follow-up of GAD, MDD, insomnia, and OCD.  Describes mood today as "ok". Pleasant. Mood symptoms - reports depression and anxiety. Denies irritability. Reports  Increased worry and rumination surrounding health concerns. Stating "I've been struggling with increased worry and rumination". Has been working with PCP regarding concerns and has been assured that he is healthy. Feels like Prozac has been helpful, but would like to increase dose. Working with therapist - Vernie Murders. Stable interest and motivation. Taking medications as prescribed. Energy levels stable. Active, has a regular exercise routine.  Enjoys some usual interests and activities. Married. Lives with wife of  30 years. Has 2 daughters age 13 and 35. Spending time with family.  Appetite adequate. Weight stable - 185 pounds.   Sleeps well most nights. Averages 7 to 8 hours. Focus and concentration stable. Completing tasks. Managing aspects of household. Work going well. 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  Denies SI or HI.  Denies AH or VH.  Previous medications: Lexapro - groggy, Wellbutrin SR 150mg     GAD-7   Flowsheet Row Office Visit from 01/22/2021 in [place] Office Visit from 02/18/2020 in [place] Office Visit from 11/01/2019 in [place]  Total GAD-7 Score 14 0 11    PHQ2-9   [place] Office Visit from 01/22/2021 in [place] Visit from 10/11/2018 in [place] Office Visit from 09/14/2017 in [place]  PHQ-2 Total Score 1 0 0  PHQ-9 Total Score 3 -- --    [place] ED from 01/04/2021 in [facility] [place]  No Risk       Review of Systems:  Review of Systems  Musculoskeletal: Negative for gait problem.  Neurological: Negative for tremors.  Psychiatric/Behavioral:       Please refer to HPI    Medications: I have reviewed the patient's current medications.  Current Outpatient Medications  Medication Sig Dispense Refill  . FLUoxetine (PROZAC) 10 MG capsule Take 1 capsule (10 mg total) by mouth daily. 90 capsule 3  . FLUoxetine (PROZAC) 10 MG capsule Take 1 capsule (10 mg total) by mouth daily. 30 capsule 0  . atorvastatin (LIPITOR) 10 MG tablet Take 1 tablet (10 mg total) by mouth daily. 90 tablet 3  . FLUoxetine (PROZAC) 20 MG capsule Take 1 capsule (20 mg total) by mouth daily. 90 capsule 3  . influenza vac split quadrivalent PF (FLUARIX) 0.5 ML injection TO BE ADMINISTERED BY PHARMACIST .5 mL 0  . levothyroxine (SYNTHROID) 112 MCG tablet Take 1 tablet (112 mcg total) by mouth daily. 90 tablet 3  . OVER THE COUNTER MEDICATION Fish oil 3 daily Xidra eye drops bid     No current facility-administered medications for this visit.    Medication Side Effects: None  Allergies:  Allergies  Allergen Reactions  . Lexapro [Escitalopram Oxalate] Other (See Comments)    grogginess    Past Medical History:  Diagnosis Date  . Anxiety   . Hypothyroidism   . IBS (irritable bowel syndrome)     Family History  Problem Relation Age of Onset  . Hypertension Father   . Heart attack Father   . Lung cancer Father   .  Benign prostatic hyperplasia Brother   . Colon cancer Neg Hx     Social History   Socioeconomic History  . Marital status: Married    Spouse name: [name]  . Number of children: 2  . Years of education: Not on file  . Highest education level: Not on file  Occupational History  . Not on file  Tobacco Use  . Smoking status: Never Smoker  . Smokeless tobacco: Never Used  Vaping Use  . Vaping Use: Never used  Substance and Sexual Activity  . Alcohol use: No  . Drug  use: No  . Sexual activity: Yes  Other Topics Concern  . Not on file  Social History Narrative  . Not on file   Social Determinants of Health   Financial Resource Strain: Not on file  Food Insecurity: Not on file  Transportation Needs: Not on file  Physical Activity: Not on file  Stress: Not on file  Social Connections: Not on file  Intimate Partner Violence: Not on file    Past Medical History, Surgical history, Social history, and Family history were reviewed and updated as appropriate.   Please see review of systems for further details on the patient's review from today.   Objective:   Physical Exam:  There were no vitals taken for this visit.  Physical Exam Constitutional:      General: He is not in acute distress. Musculoskeletal:        General: No deformity.  Neurological:     Mental Status: He is alert and oriented to person, place, and time.     Coordination: Coordination normal.  Psychiatric:        Attention and Perception: Attention and perception normal. He does not perceive auditory or visual hallucinations.        Mood and Affect: Mood normal. Mood is not anxious or depressed. Affect is not labile, blunt, angry or inappropriate.        Speech: Speech normal.        Behavior: Behavior normal.        Thought Content: Thought content normal. Thought content is not paranoid or delusional. Thought content does not include homicidal or suicidal ideation. Thought content does not include homicidal or suicidal plan.        Cognition and Memory: Cognition and memory normal.        Judgment: Judgment normal.     Comments: Insight intact     Lab Review:     Component Value Date/Time   NA 143 01/15/2021 0842   K 4.2 01/15/2021 0842   CL 103 01/15/2021 0842   CO2 25 01/15/2021 0842   GLUCOSE 104 (H) 01/15/2021 0842   GLUCOSE 116 (H) 12/18/2019 1658   BUN 19 01/15/2021 0842   CREATININE 1.13 01/15/2021 0842   CREATININE 1.07 11/01/2014 0756   CALCIUM 9.8  01/15/2021 0842   PROT 6.3 01/15/2021 0842   ALBUMIN 4.7 01/15/2021 0842   AST 20 01/15/2021 0842   ALT 31 01/15/2021 0842   ALKPHOS 72 01/15/2021 0842   BILITOT 0.8 01/15/2021 0842   GFRNONAA 72 01/15/2021 0842   GFRAA 84 01/15/2021 0842       Component Value Date/Time   WBC 7.3 01/15/2021 0842   WBC 8.4 11/19/2019 2115   RBC 5.19 01/15/2021 0842   RBC 5.28 11/19/2019 2115   HGB 15.6 01/15/2021 0842   HCT 45.8 01/15/2021 0842   PLT 251 01/15/2021 0842   MCV 88 01/15/2021 0842   MCH  30.1 01/15/2021 0842   MCH 30.1 11/19/2019 2115   MCHC 34.1 01/15/2021 0842   MCHC 35.1 11/19/2019 2115   RDW 12.6 01/15/2021 0842   LYMPHSABS 1.8 01/15/2021 0842   EOSABS 0.8 (H) 01/15/2021 0842   BASOSABS 0.1 01/15/2021 0842    No results found for: POCLITH, LITHIUM   No results found for: PHENYTOIN, PHENOBARB, VALPROATE, CBMZ   .res Assessment: Plan:    Plan:  PDMP reviewed  1. Increase Prozac 20mg  to 30mg  daily 2. Continue Ativan 0.5mg  daily prn  RTC 6 months  Patient advised to contact office with any questions, adverse effects, or acute worsening in signs and symptoms.  Diagnoses and all orders for this visit:  Obsessive-compulsive disorder, unspecified type -     FLUoxetine (PROZAC) 20 MG capsule; Take 1 capsule (20 mg total) by mouth daily. -     FLUoxetine (PROZAC) 10 MG capsule; Take 1 capsule (10 mg total) by mouth daily. -     FLUoxetine (PROZAC) 10 MG capsule; Take 1 capsule (10 mg total) by mouth daily.  Major depressive disorder, recurrent episode, moderate (HCC) -     FLUoxetine (PROZAC) 20 MG capsule; Take 1 capsule (20 mg total) by mouth daily. -     FLUoxetine (PROZAC) 10 MG capsule; Take 1 capsule (10 mg total) by mouth daily. -     FLUoxetine (PROZAC) 10 MG capsule; Take 1 capsule (10 mg total) by mouth daily.  Generalized anxiety disorder -     FLUoxetine (PROZAC) 20 MG capsule; Take 1 capsule (20 mg total) by mouth daily. -     FLUoxetine (PROZAC) 10 MG  capsule; Take 1 capsule (10 mg total) by mouth daily. -     FLUoxetine (PROZAC) 10 MG capsule; Take 1 capsule (10 mg total) by mouth daily.     Please see After Visit Summary for patient specific instructions.  Future Appointments  Date Time Provider [place]  03/19/2021  3:50 PM Kathyrn Drown, MD [facility] [place]    No orders of the defined types were placed in this encounter.   -------------------------------

## 2021-04-05 ENCOUNTER — Other Ambulatory Visit: Payer: Self-pay | Admitting: Cardiovascular Disease

## 2021-04-05 DIAGNOSIS — E7800 Pure hypercholesterolemia, unspecified: Secondary | ICD-10-CM

## 2021-04-30 ENCOUNTER — Encounter: Payer: Self-pay | Admitting: Adult Health

## 2021-04-30 ENCOUNTER — Other Ambulatory Visit: Payer: Self-pay

## 2021-04-30 ENCOUNTER — Ambulatory Visit (INDEPENDENT_AMBULATORY_CARE_PROVIDER_SITE_OTHER): Payer: 59 | Admitting: Adult Health

## 2021-04-30 DIAGNOSIS — G4700 Insomnia, unspecified: Secondary | ICD-10-CM

## 2021-04-30 DIAGNOSIS — F411 Generalized anxiety disorder: Secondary | ICD-10-CM | POA: Diagnosis not present

## 2021-04-30 DIAGNOSIS — F331 Major depressive disorder, recurrent, moderate: Secondary | ICD-10-CM

## 2021-04-30 DIAGNOSIS — F429 Obsessive-compulsive disorder, unspecified: Secondary | ICD-10-CM

## 2021-04-30 NOTE — Progress Notes (Signed)
John Wu [id number] Sep 04, 1966 55 y.o.  Subjective:   Patient ID:  John Wu is a 55 y.o. (DOB 14-Jul-1966) male.  Chief Complaint: No chief complaint on file.   HPI PHILIPE LASWELL presents to the office today for follow-up of GAD, MDD, insomnia, and OCD.  Describes mood today as "ok". Pleasant. Mood symptoms - denies depression, anxiety and irritability. Reports  decreased worry and rumination. No longer concerned about health issues. Has had some instances that would have typically bothered him, and was able to work through it. Recently gave a sermon at church. Family members with Covid. Feels like increase in Prozac is working well. Working with therapist - Vernie Murders. Stable interest and motivation. Taking medications as prescribed. Energy levels stable. Active, has a regular exercise routine.  Enjoys some usual interests and activities. Married. Lives with wife of  30 years. Has 2 daughters age 76 and 70. Spending time with family.  Appetite adequate. Weight stable - 185 pounds.   Sleeps well most nights. Averages 7 to 8 hours. Focus and concentration stable. Completing tasks. Managing aspects of household. Work going well. 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  Denies SI or HI.  Denies AH or VH.  Previous medications: Lexapro - groggy, Wellbutrin SR 150mg    GAD-7   Flowsheet Row Office Visit from 01/22/2021 in [place] Office Visit from 02/18/2020 in [place] Office Visit from 11/01/2019 in [place]  Total GAD-7 Score 14 0 11    PHQ2-9   [place] Office Visit from 01/22/2021 in [place] Visit from 10/11/2018 in [place] Office Visit from 09/14/2017 in [place]  PHQ-2 Total Score 1 0 0  PHQ-9 Total Score 3 -- --    [place] ED from 01/04/2021 in [facility] [place] No Risk        Review of Systems:  Review of Systems  Musculoskeletal: Negative for gait problem.  Neurological: Negative for tremors.  Psychiatric/Behavioral:       Please refer to HPI    Medications: I have reviewed the patient's current medications.  Current Outpatient Medications  Medication Sig Dispense Refill  . atorvastatin (LIPITOR) 10 MG tablet TAKE 1 TABLET BY MOUTH  DAILY 90 tablet 0  . FLUoxetine (PROZAC) 10 MG capsule Take 1 capsule (10 mg total) by mouth daily. 90 capsule 3  . FLUoxetine (PROZAC) 10 MG capsule Take 1 capsule (10 mg total) by mouth daily. 30 capsule 0  . FLUoxetine (PROZAC) 20 MG capsule Take 1 capsule (20 mg total) by mouth daily. 90 capsule 3  . influenza vac split quadrivalent PF (FLUARIX) 0.5 ML injection TO BE ADMINISTERED BY PHARMACIST (Patient taking differently: TO BE ADMINISTERED BY PHARMACIST) .5 mL 0  . levothyroxine (SYNTHROID) 112 MCG tablet Take 1 tablet (112 mcg total) by mouth daily. 90 tablet 3  . OVER THE COUNTER MEDICATION Fish oil 3 daily Xidra eye drops bid     No current facility-administered medications for this visit.    Medication Side Effects: None  Allergies:  Allergies  Allergen Reactions  . Lexapro [Escitalopram Oxalate] Other (See Comments)    grogginess    Past Medical History:  Diagnosis Date  . Anxiety   . Hypothyroidism   . IBS (irritable bowel syndrome)     Past Medical History, Surgical history, Social history, and Family history were reviewed and updated as appropriate.  Please see review of systems for further details on the patient's review from today.   Objective:   Physical Exam:  There were no vitals taken for this visit.  Physical Exam Constitutional:      General: He is not in acute distress. Musculoskeletal:        General: No deformity.  Neurological:     Mental Status: He is alert and oriented to person, place, and time.     Coordination: Coordination normal.  Psychiatric:        Attention and  Perception: Attention and perception normal. He does not perceive auditory or visual hallucinations.        Mood and Affect: Mood normal. Mood is not anxious or depressed. Affect is not labile, blunt, angry or inappropriate.        Speech: Speech normal.        Behavior: Behavior normal.        Thought Content: Thought content normal. Thought content is not paranoid or delusional. Thought content does not include homicidal or suicidal ideation. Thought content does not include homicidal or suicidal plan.        Cognition and Memory: Cognition and memory normal.        Judgment: Judgment normal.     Comments: Insight intact     Lab Review:     Component Value Date/Time   NA 143 01/15/2021 0842   K 4.2 01/15/2021 0842   CL 103 01/15/2021 0842   CO2 25 01/15/2021 0842   GLUCOSE 104 (H) 01/15/2021 0842   GLUCOSE 116 (H) 12/18/2019 1658   BUN 19 01/15/2021 0842   CREATININE 1.13 01/15/2021 0842   CREATININE 1.07 11/01/2014 0756   CALCIUM 9.8 01/15/2021 0842   PROT 6.3 01/15/2021 0842   ALBUMIN 4.7 01/15/2021 0842   AST 20 01/15/2021 0842   ALT 31 01/15/2021 0842   ALKPHOS 72 01/15/2021 0842   BILITOT 0.8 01/15/2021 0842   GFRNONAA 72 01/15/2021 0842   GFRAA 84 01/15/2021 0842       Component Value Date/Time   WBC 7.3 01/15/2021 0842   WBC 8.4 11/19/2019 2115   RBC 5.19 01/15/2021 0842   RBC 5.28 11/19/2019 2115   HGB 15.6 01/15/2021 0842   HCT 45.8 01/15/2021 0842   PLT 251 01/15/2021 0842   MCV 88 01/15/2021 0842   MCH 30.1 01/15/2021 0842   MCH 30.1 11/19/2019 2115   MCHC 34.1 01/15/2021 0842   MCHC 35.1 11/19/2019 2115   RDW 12.6 01/15/2021 0842   LYMPHSABS 1.8 01/15/2021 0842   EOSABS 0.8 (H) 01/15/2021 0842   BASOSABS 0.1 01/15/2021 0842    No results found for: POCLITH, LITHIUM   No results found for: PHENYTOIN, PHENOBARB, VALPROATE, CBMZ   .res Assessment: Plan:    Plan:  PDMP reviewed  1. Prozac 30mg  daily 2. Ativan 0.5mg  daily prn  RTC 6  months  Patient advised to contact office with any questions, adverse effects, or acute worsening in signs and symptoms.  Discussed potential benefits, risk, and side effects of benzodiazepines to include potential risk of tolerance and dependence, as well as possible drowsiness.  Advised patient not to drive if experiencing drowsiness and to take lowest possible effective dose to minimize risk of dependence and tolerance.    Diagnoses and all orders for this visit:  Obsessive-compulsive disorder, unspecified type  Major depressive disorder, recurrent episode, moderate (HCC)  Generalized anxiety disorder  Insomnia, unspecified type     Please see After Visit Summary  for patient specific instructions.  Future Appointments  Date Time Provider [place]  05/27/2021 11:00 AM Josue Hector, MD [facility] [place] [facility]  06/18/2021  8:20 AM Kathyrn Drown, MD [facility]  11/02/2021  9:00 AM Dequon Schnebly, Berdie Ogren, NP [facility] None    No orders of the defined types were placed in this encounter.   -------------------------------

## 2021-05-20 NOTE — Progress Notes (Signed)
CARDIOLOGY CONSULT NOTE       Patient ID: John Wu MRN: [id number] DOB/AGE: Sep 30, 1966 55 y.o.  Admit date: (Not on file) Referring Physician: [name] [facility] Primary Physician: Erven Colla, DO Primary Cardiologist: [name]  Reason for F/U:  Chest Pain   HPI: 55 y.o. with history of hypothyroidism, IBS, Anxiety and atypical chest pain Seen [facility] ED 11/19/19 r/o normal ECG and CXR. He is on Prozac and sees a therapist. 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  He is coaching baseball for [employer] is Software engineer at [employer] and has daughter who is becoming one Whole family went to [facility]   Calcium score done  06/13/20 was 17 58 th percentile Started on statin  Needs f/u labs   Has had f/u labs with primary and is at goal LDL 64   ROS All other systems reviewed and negative except as noted above  Past Medical History:  Diagnosis Date   Anxiety    Hypothyroidism    IBS (irritable bowel syndrome)     Family History  Problem Relation Age of Onset   Hypertension Father    Heart attack Father    Lung cancer Father    Benign prostatic hyperplasia Brother    Colon cancer Neg Hx     Social History   Socioeconomic History   Marital status: Married    Spouse name: Monica   Number of children: 2   Years of education: Not on file   Highest education level: Not on file  Occupational History   Not on file  Tobacco Use   Smoking status: Never   Smokeless tobacco: Never  Vaping Use   Vaping Use: Never used  Substance and Sexual Activity   Alcohol use: No   Drug use: No   Sexual activity: Yes  Other Topics Concern   Not on file  Social History Narrative   Not on file   Social Determinants of Health   Financial Resource Strain: Not on file  Food Insecurity: Not on file  Transportation Needs: Not on file  Physical Activity: Not on file  Stress: Not on file  Social Connections: Not on file   Intimate Partner Violence: Not on file    Past Surgical History:  Procedure Laterality Date   CHOLECYSTECTOMY  2007   VASECTOMY  2000      Physical Exam: Blood pressure 126/72, pulse 73, height 5\' 10"  (1.778 m), weight 88.5 kg, SpO2 98 %.    Affect appropriate Healthy:  appears stated age 50: normal Neck supple with no adenopathy JVP normal no bruits no thyromegaly Lungs clear with no wheezing and good diaphragmatic motion Heart:  S1/S2 no murmur, no rub, gallop or click PMI normal Abdomen: benighn, BS positve, no tenderness, no AAA no bruit.  No HSM or HJR Distal pulses intact with no bruits No edema Neuro non-focal Skin warm and dry No muscular weakness   Labs:   Lab Results  Component Value Date   WBC 7.3 01/15/2021   HGB 15.6 01/15/2021   HCT 45.8 01/15/2021   MCV 88 01/15/2021   PLT 251 01/15/2021   No results for input(s): NA, K, CL, CO2, BUN, CREATININE, CALCIUM, PROT, BILITOT, ALKPHOS, ALT, AST, GLUCOSE in the last 168 hours.  Invalid input(s): LABALBU No results found for: CKTOTAL, CKMB, CKMBINDEX, TROPONINI  Lab Results  Component Value Date   CHOL 127  01/15/2021   CHOL 135 09/16/2020   CHOL 185 10/10/2019   Lab Results  Component Value Date   HDL 47 01/15/2021   HDL 41 09/16/2020   HDL 36 (L) 10/10/2019   Lab Results  Component Value Date   LDLCALC 64 01/15/2021   LDLCALC 75 09/16/2020   LDLCALC 118 (H) 10/10/2019   Lab Results  Component Value Date   TRIG 78 01/15/2021   TRIG 100 09/16/2020   TRIG 177 (H) 10/10/2019   Lab Results  Component Value Date   CHOLHDL 2.7 01/15/2021   CHOLHDL 3.3 09/16/2020   CHOLHDL 5.1 (H) 10/10/2019   No results found for: LDLDIRECT    Radiology: No results found.  EKG: 11/20/19 NSR normal ECG rate 78 bpm   ASSESSMENT AND PLAN:   1. Chest Pain:  Atypical normal labs ECG and CXR  Calcium score 06/14/20 Was 69 63 th percentile for age Started on statin 04/06/21 as LDL was 118 will need f/u labs    2. Thyroid:  On replacement TSH normal 01/15/21  3. Anxiety: major issue causing somatic issues On Prozac and Ativan  f/u with Dr Wolfgang Phoenix and therapist Seems improved to stable based on last note 04/30/21 from [facility] NP   F/U in a year   Signed: Jenkins Rouge 05/27/2021, 10:58 AM

## 2021-05-27 ENCOUNTER — Ambulatory Visit (INDEPENDENT_AMBULATORY_CARE_PROVIDER_SITE_OTHER): Payer: 59 | Admitting: Cardiovascular Disease

## 2021-05-27 ENCOUNTER — Encounter: Payer: Self-pay | Admitting: Cardiovascular Disease

## 2021-05-27 ENCOUNTER — Other Ambulatory Visit: Payer: Self-pay

## 2021-05-27 VITALS — BP 126/72 | HR 73 | Ht 70.0 in | Wt 195.0 lb

## 2021-05-27 DIAGNOSIS — E782 Mixed hyperlipidemia: Secondary | ICD-10-CM

## 2021-05-27 DIAGNOSIS — R079 Chest pain, unspecified: Secondary | ICD-10-CM

## 2021-05-27 DIAGNOSIS — E039 Hypothyroidism, unspecified: Secondary | ICD-10-CM | POA: Diagnosis not present

## 2021-05-27 NOTE — Patient Instructions (Signed)
Medication Instructions:  Your physician recommends that you continue on your current medications as directed. Please refer to the Current Medication list given to you today.  *If you need a refill on your cardiac medications before your next appointment, please call your pharmacy*   Lab Work: None today  If you have labs (blood work) drawn today and your tests are completely normal, you will receive your results only by: MyChart Message (if you have MyChart) OR A paper copy in the mail If you have any lab test that is abnormal or we need to change your treatment, we will call you to review the results.   Testing/Procedures: None today    Follow-Up: At [facility], you and your health needs are our priority.  As part of our continuing mission to provide you with exceptional heart care, we have created designated Provider Care Teams.  These Care Teams include your primary Cardiologist (physician) and Advanced Practice Providers (APPs -  Physician Assistants and Nurse Practitioners) who all work together to provide you with the care you need, when you need it.  We recommend signing up for the patient portal called "MyChart".  Sign up information is provided on this After Visit Summary.  MyChart is used to connect with patients for Virtual Visits (Telemedicine).  Patients are able to view lab/test results, encounter notes, upcoming appointments, etc.  Non-urgent messages can be sent to your provider as well.   To learn more about what you can do with MyChart, go to [email or web address].    Your next appointment:   12 month(s)  The format for your next appointment:   In Person  Provider:   Peter Nishan, MD   Other Instructions None     

## 2021-06-18 ENCOUNTER — Encounter: Payer: Self-pay | Admitting: Family Medicine

## 2021-06-18 ENCOUNTER — Ambulatory Visit (INDEPENDENT_AMBULATORY_CARE_PROVIDER_SITE_OTHER): Payer: 59 | Admitting: Family Medicine

## 2021-06-18 ENCOUNTER — Other Ambulatory Visit: Payer: Self-pay

## 2021-06-18 VITALS — BP 119/77 | HR 70 | Temp 97.3°F | Wt 194.8 lb

## 2021-06-18 DIAGNOSIS — E039 Hypothyroidism, unspecified: Secondary | ICD-10-CM | POA: Diagnosis not present

## 2021-06-18 DIAGNOSIS — F411 Generalized anxiety disorder: Secondary | ICD-10-CM

## 2021-06-18 DIAGNOSIS — E7800 Pure hypercholesterolemia, unspecified: Secondary | ICD-10-CM

## 2021-06-18 DIAGNOSIS — E785 Hyperlipidemia, unspecified: Secondary | ICD-10-CM | POA: Diagnosis not present

## 2021-06-18 DIAGNOSIS — Z1211 Encounter for screening for malignant neoplasm of colon: Secondary | ICD-10-CM | POA: Diagnosis not present

## 2021-06-18 HISTORY — DX: Hyperlipidemia, unspecified: E78.5

## 2021-06-18 MED ORDER — ATORVASTATIN CALCIUM 10 MG PO TABS: 10.0000 mg | ORAL_TABLET | Freq: Every day | ORAL | 2 refills | Status: DC

## 2021-06-18 NOTE — Patient Instructions (Signed)
Please remember to schedule annual wellness visit for February 2023 and also do some lab work 1 to 2 weeks before that visit  You can connect with [place] a few weeks before your wellness visit and we can order your lab work thanks-Dr. Nicki Reaper

## 2021-06-18 NOTE — Progress Notes (Signed)
   Subjective:    Patient ID: John Wu, male    DOB: August 15, 1966, 55 y.o.   MRN: [id number]  HPI Pt here for 3 month follow up. Pt states no issues with urinary issues. Pt states had yearly wellness with Dr.Taylor earlier this year.   Hypothyroidism, unspecified type  Elevated LDL cholesterol level - Plan: atorvastatin (LIPITOR) 10 MG tablet  Generalized anxiety disorder  Hyperlipidemia, unspecified hyperlipidemia type  Encounter for screening colonoscopy - Plan: Ambulatory referral to Gastroenterology  Review of Systems     Objective:   Physical Exam  General-in no acute distress Eyes-no discharge Lungs-respiratory rate normal, CTA CV-no murmurs,RRR Extremities skin warm dry no edema Neuro grossly normal Behavior normal, alert       Assessment & Plan:  Patient is overall doing very well Anxiety under good control Sees a psychiatrist on a regular basis and therapist Trying to eat healthy and stay active No prostate symptoms recently Colonoscopy recommended referral given  Wellness exam in February with previsit labs recommended  Hyperlipidemia on medication refills given check labs in February  Hypothyroidism continue medication we will check labs in February

## 2021-07-04 IMAGING — MR MR ABDOMEN WO/W CM
9 of 18 series · 20 of 48 positions shown · IV contrast (gadavist)
Comparison: CT scan 12/20/2019

CLINICAL DATA: Complex right renal cyst on recent CT.

EXAM:
MRI ABDOMEN WITHOUT AND WITH CONTRAST
TECHNIQUE: Multiplanar multisequence MR imaging of the abdomen was performed
both before and after the administration of intravenous contrast.
CONTRAST:  8.5mL GADAVIST GADOBUTROL 1 MMOL/ML IV SOLN

[Series 4: T2 fat-sat · axial · 5.0mm · 0.78mm/px · z∈[-177,+68]mm · 3 of 50 slices shown]
[im 1/50]
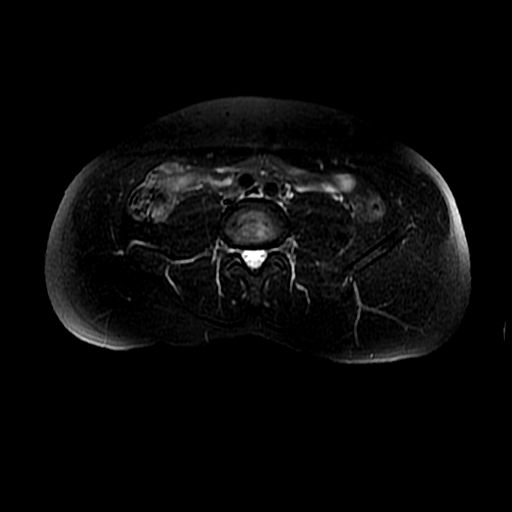
[im 25/50]
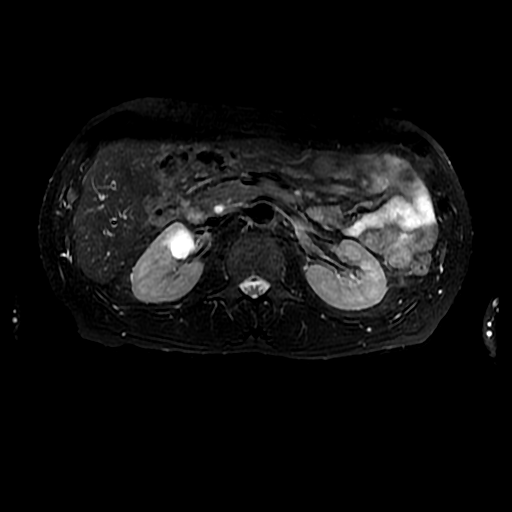
[im 50/50]
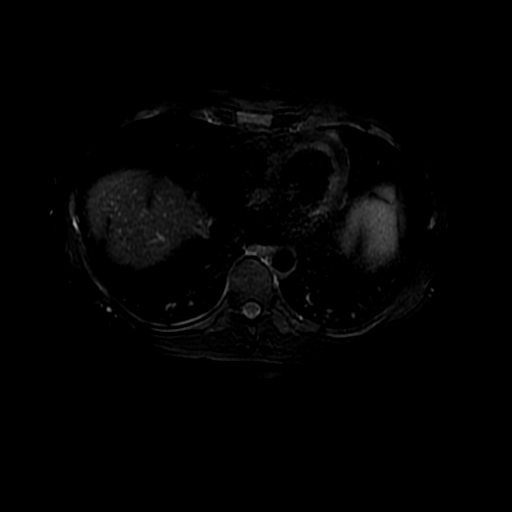

[Series 5: DWI b500 · axial · 6.0mm · 1.48mm/px · z∈[-188,+38]mm · 2 of 59 slices shown]
[im 1/59]
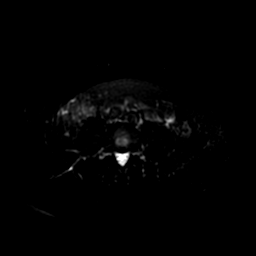
[im 59/59]
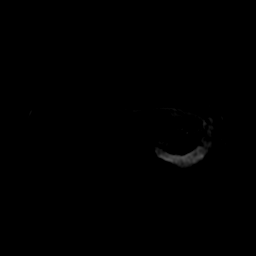

[Series 6: T2 · axial · 5.0mm · 0.78mm/px · z∈[-176,+39]mm · 2 of 44 slices shown (1 of 2)]
[im 1/44]
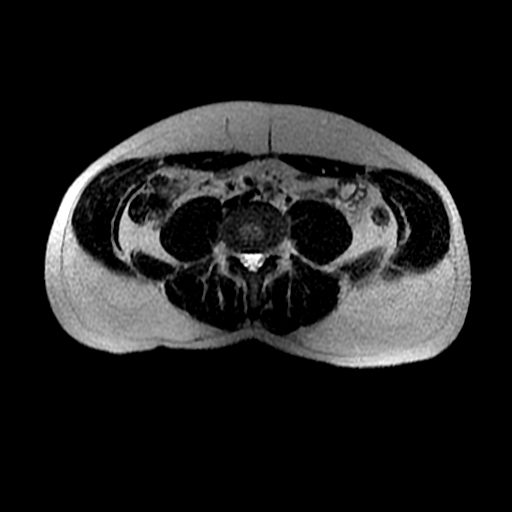
[im 44/44]
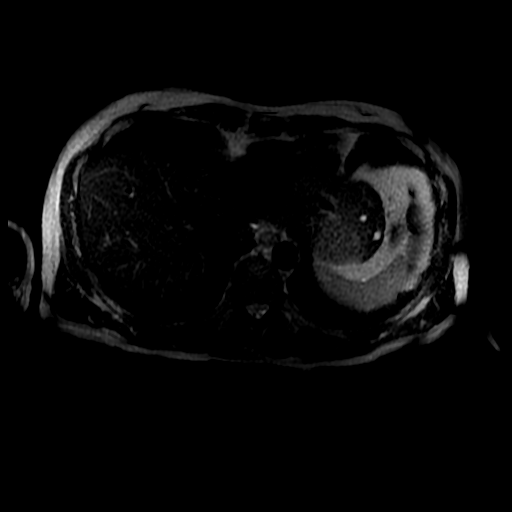

[Series 7: T2 · coronal · 5.0mm · 0.78mm/px · 1 of 32 slices shown (2 of 2)]
[im 1/32]
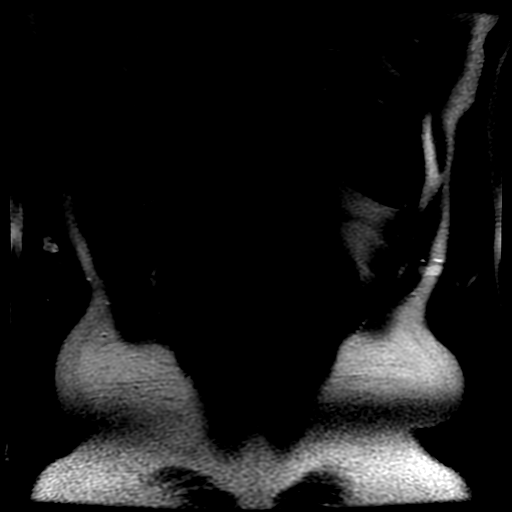

[Series 8: bSSFP · axial · 5.0mm · 0.78mm/px · z∈[-176,+39]mm · 2 of 44 slices shown]
[im 1/44]
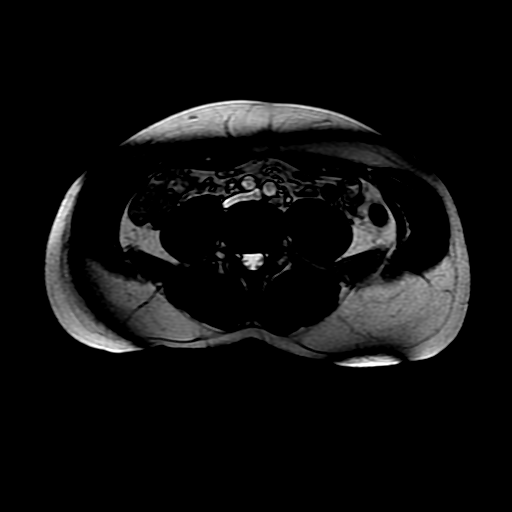
[im 44/44]
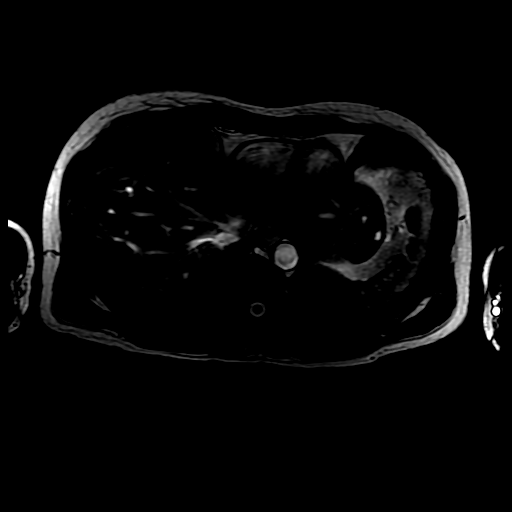

[Series 9: ax dualecho bh · axial · 5.0mm · 0.78mm/px · z∈[-176,+69]mm · 4 of 100 slices shown]
[im 1/100]
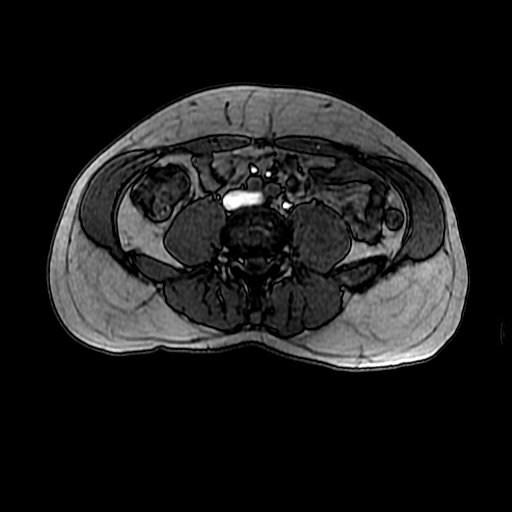
[im 34/100]
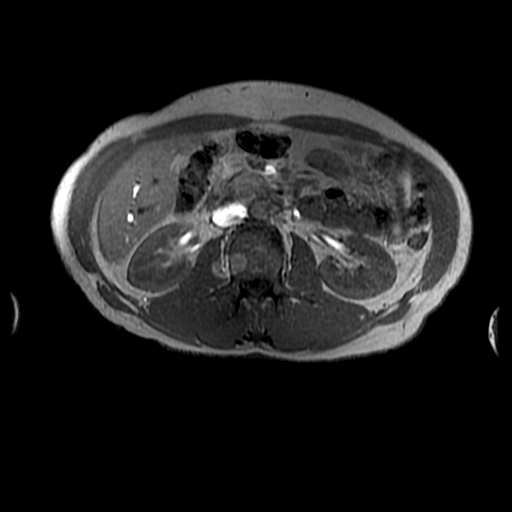
[im 67/100]
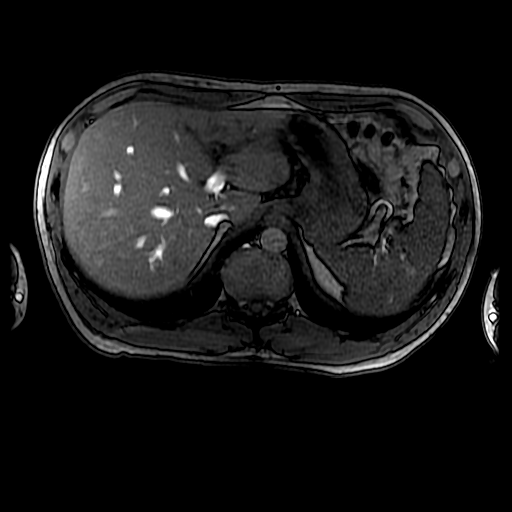
[im 100/100]
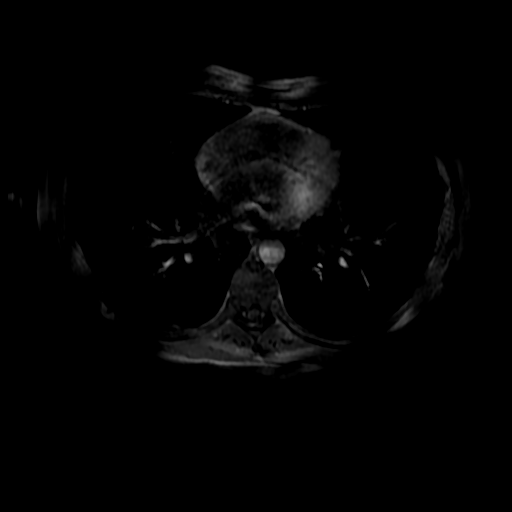

[Series 500: DWI · axial · 6.0mm · 1.48mm/px · 1 of 30 slices shown]
[im 1/30]
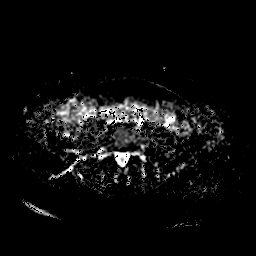

[Series 1000: T1 dynamic · axial · 5.0mm · 0.78mm/px · z∈[-181,+37]mm · 3 of 88 slices shown (1 of 2)]
[im 1/88]
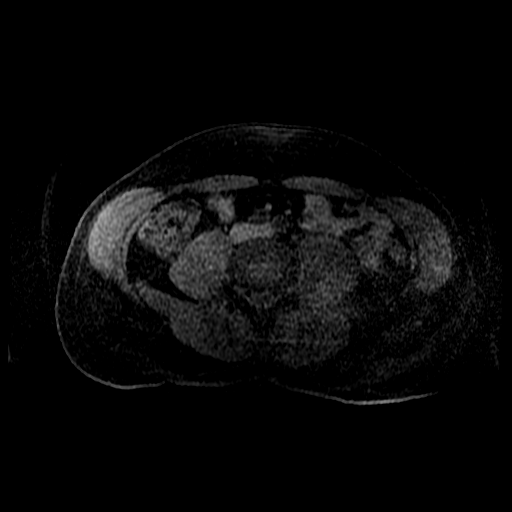
[im 44/88]
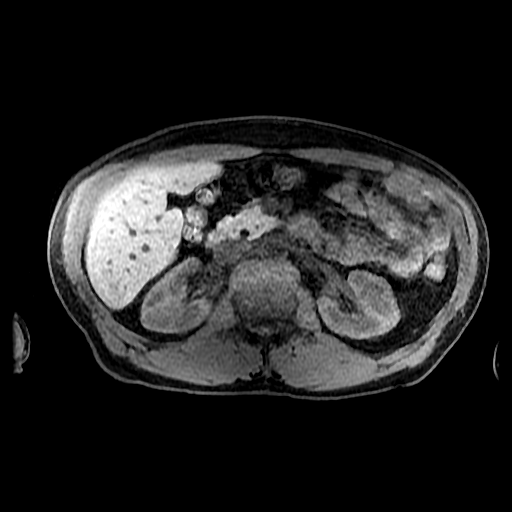
[im 88/88]
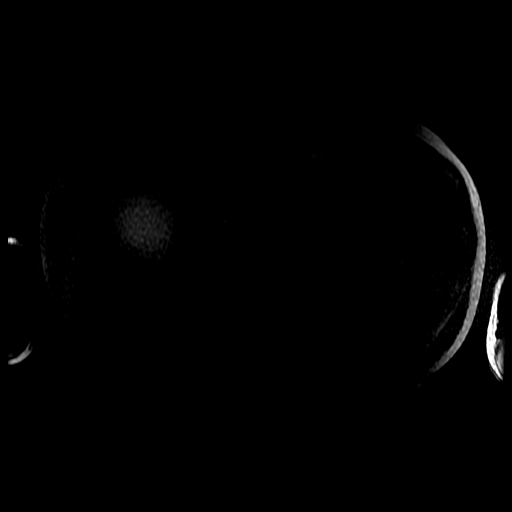

[Series 1001: T1 dynamic · axial · 5.0mm · 0.78mm/px · z∈[-181,-73]mm · 2 of 88 slices shown (2 of 2)]
[im 1/88]
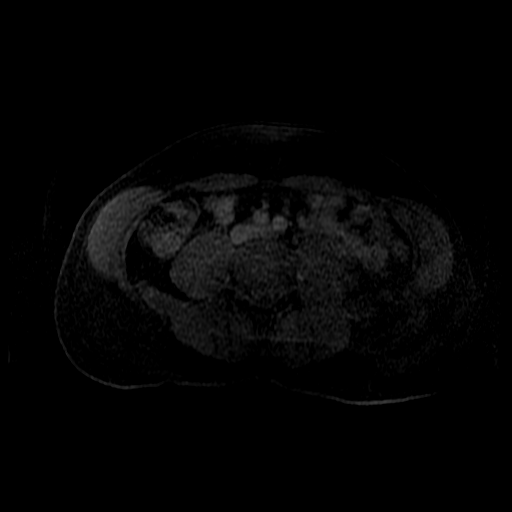
[im 44/88]
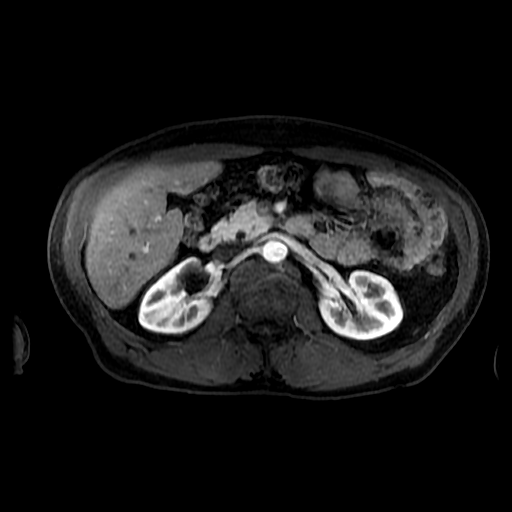

[20 of 48 positions shown; findings below may reference images not displayed]

FINDINGS: Lower chest: Unremarkable.

Hepatobiliary: Liver measures 18.7 cm craniocaudal length. Several
very tiny simple cysts are identified in the liver parenchyma. No
suspicious enhancing parenchymal abnormality. Gallbladder is
surgically absent. No intrahepatic or extrahepatic biliary dilation.

Pancreas: No focal mass lesion. No dilatation of the main duct. No
intraparenchymal cyst. No peripancreatic edema.

Spleen:  No splenomegaly. No focal mass lesion.

Adrenals/Urinary Tract:  No adrenal nodule or mass.

2.9 x 1.7 x 2.2 cm well-defined homogeneous T1 hypointense, T2
hyperintense lesion is identified in the upper pole right kidney.
This shows no enhancement after IV contrast administration and is
compatible with a Bosniak I benign cyst.

Cystic lesion of concern on recent CT scan is identified in the
anterior upper interpolar right kidney and measures 2.9 x 2.5 x
cm. There is no internal architecture in this cyst as suggested on
the recent CT scan. No mural irregularity or mural nodularity. No
enhancement after IV contrast administration. This lesion is also a
Bosniak I benign cyst.

No suspicious enhancing lesion in the right kidney.

Left kidney has normal MR imaging features.

Stomach/Bowel: Stomach is unremarkable. No gastric wall thickening.
No evidence of outlet obstruction. Duodenum is normally positioned
as is the ligament of Treitz. No small bowel or colonic dilatation
within the visualized abdomen.

Vascular/Lymphatic: No abdominal aortic aneurysm. No abdominal
lymphadenopathy.

Other:  No intraperitoneal free fluid.

Musculoskeletal: No abnormal marrow enhancement within the
visualized bony anatomy.
IMPRESSION: 1. Two simple cysts identified in the right kidney. The lesion of
concern on recent CT scan represents a 2.9 cm Bosniak I benign cyst
in the upper pole right kidney.
2. Several tiny simple cysts in the liver parenchyma.
3. Liver minimally increased in craniocaudal length at 18.7 cm.
4. Status post cholecystectomy.

## 2021-08-31 ENCOUNTER — Telehealth: Payer: Self-pay | Admitting: Family Medicine

## 2021-08-31 NOTE — Telephone Encounter (Signed)
Referral sent to Dr [facility] office for screening colonoscopy 06/2021. Patient can contact their office to schedule an appointment. Patient notified and verbalized understanding.

## 2021-08-31 NOTE — Telephone Encounter (Signed)
Patient is requesting to schedule his 5 year colonoscopy

## 2021-09-01 ENCOUNTER — Encounter: Payer: Self-pay | Admitting: Internal Medicine

## 2021-09-08 ENCOUNTER — Encounter: Payer: Self-pay | Admitting: Internal Medicine

## 2021-09-16 ENCOUNTER — Other Ambulatory Visit (HOSPITAL_COMMUNITY): Payer: Self-pay

## 2021-09-16 ENCOUNTER — Other Ambulatory Visit: Payer: Self-pay

## 2021-09-16 ENCOUNTER — Ambulatory Visit: Payer: 59

## 2021-09-16 VITALS — Ht 70.0 in | Wt 197.0 lb

## 2021-09-16 DIAGNOSIS — Z86010 Personal history of colonic polyps: Secondary | ICD-10-CM

## 2021-09-16 MED ORDER — SUTAB 1479-225-188 MG PO TABS
12.0000 | ORAL_TABLET | ORAL | 0 refills | Status: DC
  Filled 2021-09-16: qty 24, 1d supply, fill #0

## 2021-09-16 NOTE — Progress Notes (Signed)

## 2021-09-18 ENCOUNTER — Encounter: Payer: Self-pay | Admitting: Internal Medicine

## 2021-10-01 ENCOUNTER — Ambulatory Visit (AMBULATORY_SURGERY_CENTER): Payer: 59 | Admitting: Internal Medicine

## 2021-10-01 ENCOUNTER — Other Ambulatory Visit: Payer: Self-pay

## 2021-10-01 ENCOUNTER — Encounter: Payer: Self-pay | Admitting: Internal Medicine

## 2021-10-01 VITALS — BP 110/73 | HR 57 | Temp 98.2°F | Resp 18 | Ht 70.0 in | Wt 197.0 lb

## 2021-10-01 DIAGNOSIS — Z86010 Personal history of colonic polyps: Secondary | ICD-10-CM

## 2021-10-01 DIAGNOSIS — D122 Benign neoplasm of ascending colon: Secondary | ICD-10-CM

## 2021-10-01 DIAGNOSIS — D120 Benign neoplasm of cecum: Secondary | ICD-10-CM

## 2021-10-01 MED ORDER — SODIUM CHLORIDE 0.9 % IV SOLN: 500.0000 mL | Freq: Once | INTRAVENOUS | Status: DC

## 2021-10-01 NOTE — Patient Instructions (Signed)
Impression/Recommendations:  Polyp, diverticulosis, and hemorrhoid handouts given to patient.  Resume previous diet. Continue present medications. Await pathology results.  Repeat colonoscopy in 5 years for surveillance.  YOU HAD AN ENDOSCOPIC PROCEDURE TODAY AT [facility]:   Refer to the procedure report that was given to you for any specific questions about what was found during the examination.  If the procedure report does not answer your questions, please call your gastroenterologist to clarify.  If you requested that your care partner not be given the details of your procedure findings, then the procedure report has been included in a sealed envelope for you to review at your convenience later.  YOU SHOULD EXPECT: Some feelings of bloating in the abdomen. Passage of more gas than usual.  Walking can help get rid of the air that was put into your GI tract during the procedure and reduce the bloating. If you had a lower endoscopy (such as a colonoscopy or flexible sigmoidoscopy) you may notice spotting of blood in your stool or on the toilet paper. If you underwent a bowel prep for your procedure, you may not have a normal bowel movement for a few days.  Please Note:  You might notice some irritation and congestion in your nose or some drainage.  This is from the oxygen used during your procedure.  There is no need for concern and it should clear up in a day or so.  SYMPTOMS TO REPORT IMMEDIATELY:  Following lower endoscopy (colonoscopy or flexible sigmoidoscopy):  Excessive amounts of blood in the stool  Significant tenderness or worsening of abdominal pains  Swelling of the abdomen that is new, acute  Fever of 100F or higher  For urgent or emergent issues, a gastroenterologist can be reached at any hour by calling [phone]. Do not use MyChart messaging for urgent concerns.    DIET:  We do recommend a small meal at first, but then you may proceed to your  regular diet.  Drink plenty of fluids but you should avoid alcoholic beverages for 24 hours.  ACTIVITY:  You should plan to take it easy for the rest of today and you should NOT DRIVE or use heavy machinery until tomorrow (because of the sedation medicines used during the test).    FOLLOW UP: Our staff will call the number listed on your records 48-72 hours following your procedure to check on you and address any questions or concerns that you may have regarding the information given to you following your procedure. If we do not reach you, we will leave a message.  We will attempt to reach you two times.  During this call, we will ask if you have developed any symptoms of COVID 19. If you develop any symptoms (ie: fever, flu-like symptoms, shortness of breath, cough etc.) before then, please call [phone].  If you test positive for Covid 19 in the 2 weeks post procedure, please call and report this information to [place].    If any biopsies were taken you will be contacted by phone or by letter within the next 1-3 weeks.  Please call us at [phone] if you have not heard about the biopsies in 3 weeks.    SIGNATURES/CONFIDENTIALITY: You and/or your care partner have signed paperwork which will be entered into your electronic medical record.  These signatures attest to the fact that that the information above on your After Visit Summary has been reviewed and is understood.  Full responsibility of the confidentiality of  this discharge information lies with you and/or your care-partner.

## 2021-10-01 NOTE — Progress Notes (Signed)
Sedate, gd SR, tolerated procedure well, VSS, report to RN 

## 2021-10-01 NOTE — Progress Notes (Signed)
VS completed by AS.  Pt's states no medical or surgical changes since previsit or office visit.  

## 2021-10-01 NOTE — Progress Notes (Signed)
Called to room to assist during endoscopic procedure.  Patient ID and intended procedure confirmed with present staff. Received instructions for my participation in the procedure from the performing physician.  

## 2021-10-01 NOTE — Op Note (Signed)
[facility] Patient Name: John Wu Procedure Date: 10/01/2021 2:01 PM MRN: [id number] Endoscopist: Docia Chuck. Henrene Pastor , MD Age: 55 Referring MD:  Date of Birth: [birth year] Gender: Male Account #: [id number] Procedure:                Colonoscopy with cold snare polypectomy x 2 Indications:              High risk colon cancer surveillance: Personal                            history of non-advanced adenoma. Index examination                            September 2017 Medicines:                Monitored Anesthesia Care Procedure:                Pre-Anesthesia Assessment:                           - Prior to the procedure, a History and Physical                            was performed, and patient medications and                            allergies were reviewed. The patient's tolerance of                            previous anesthesia was also reviewed. The risks                            and benefits of the procedure and the sedation                            options and risks were discussed with the patient.                            All questions were answered, and informed consent                            was obtained. Prior Anticoagulants: The patient has                            taken no previous anticoagulant or antiplatelet                            agents. ASA Grade Assessment: II - A patient with                            mild systemic disease. After reviewing the risks                            and benefits, the patient was deemed in  satisfactory condition to undergo the procedure.                           After obtaining informed consent, the colonoscope                            was passed under direct vision. Throughout the                            procedure, the patient's blood pressure, pulse, and                            oxygen saturations were monitored continuously. The                            Olympus  CF-HQ190L (Serial# [id number]) Colonoscope was                            introduced through the anus and advanced to the the                            cecum, identified by appendiceal orifice and                            ileocecal valve. The ileocecal valve, appendiceal                            orifice, and rectum were photographed. The quality                            of the bowel preparation was excellent. The                            colonoscopy was performed without difficulty. The                            patient tolerated the procedure well. The bowel                            preparation used was SUPREP via split dose                            instruction. Scope In: 2:09:02 PM Scope Out: 2:26:20 PM Scope Withdrawal Time: 0 hours 14 minutes 54 seconds  Total Procedure Duration: 0 hours 17 minutes 18 seconds  Findings:                 Two polyps were found in the cecum and ascending                            colon. The polyps were 2 to 4 mm in size. These                            polyps were removed with a cold snare. Resection  and retrieval were complete.                           Multiple diverticula were found in the left colon.                           Internal hemorrhoids were found during retroflexion.                           The exam was otherwise without abnormality on                            direct and retroflexion views. Complications:            No immediate complications. Estimated blood loss:                            None. Estimated Blood Loss:     Estimated blood loss: none. Impression:               - Two 2 to 4 mm polyps in the cecum and in the                            ascending colon, removed with a cold snare.                            Resected and retrieved.                           - Diverticulosis in the left colon.                           - Internal hemorrhoids.                           - The examination was  otherwise normal on direct                            and retroflexion views. Recommendation:           - Repeat colonoscopy in 5 years for surveillance.                           - Patient has a contact number available for                            emergencies. The signs and symptoms of potential                            delayed complications were discussed with the                            patient. Return to normal activities tomorrow.                            Written discharge instructions were provided to the  patient.                           - Resume previous diet.                           - Continue present medications.                           - Await pathology results. Docia Chuck. Henrene Pastor, MD 10/01/2021 2:32:50 PM This report has been signed electronically.

## 2021-10-01 NOTE — Progress Notes (Signed)
HISTORY OF PRESENT ILLNESS:  John Wu is a 55 y.o. male presents today for surveillance colonoscopy.  Index examination performed September 2017.  This revealed a small tubular adenoma, sigmoid diverticulosis, and internal hemorrhoids.  He is now for surveillance.  No active complaints  REVIEW OF SYSTEMS:  All non-GI ROS negative.  Past Medical History:  Diagnosis Date   Anxiety    Hyperlipidemia 06/18/2021   Hypothyroidism    IBS (irritable bowel syndrome)     Past Surgical History:  Procedure Laterality Date   CHOLECYSTECTOMY  12/13/2005   COLOSTOMY     VASECTOMY  12/13/1998    Social History QUENCY TOBER  reports that he has never smoked. He has never used smokeless tobacco. He reports that he does not drink alcohol and does not use drugs.  family history includes Benign prostatic hyperplasia in his brother; Heart attack in his father; Hypertension in his father; Lung cancer in his father.  Allergies  Allergen Reactions   Lexapro [Escitalopram Oxalate] Other (See Comments)    grogginess       PHYSICAL EXAMINATION:  Vital signs: BP (!) 138/99   Pulse 87   Temp 98.2 F (36.8 C) (Temporal)   Ht 5\' 10"  (1.778 m)   Wt 197 lb (89.4 kg)   SpO2 98%   BMI 28.27 kg/m  General: Well-developed, well-nourished, no acute distress HEENT: Sclerae are anicteric, conjunctiva pink. Oral mucosa intact Lungs: Clear Heart: Regular Abdomen: soft, nontender, nondistended, no obvious ascites, no peritoneal signs, normal bowel sounds. No organomegaly. Extremities: No edema Psychiatric: alert and oriented x3. Cooperative     ASSESSMENT:  History of adenomatous colon polyps.  Due for surveillance colonoscopy   PLAN:  Surveillance colonoscopy

## 2021-10-05 ENCOUNTER — Telehealth: Payer: Self-pay

## 2021-10-05 NOTE — Telephone Encounter (Signed)
Left message on answering machine. 

## 2021-10-05 NOTE — Telephone Encounter (Signed)
  Follow up Call-  Call back number 10/01/2021  Post procedure Call Back phone  # [phone]  Permission to leave phone message Yes  Some recent data might be hidden     Patient questions:  Do you have a fever, pain , or abdominal swelling? No. Pain Score  0 *  Have you tolerated food without any problems? Yes.    Have you been able to return to your normal activities? Yes.    Do you have any questions about your discharge instructions: Diet   No. Medications  No. Follow up visit  No.  Do you have questions or concerns about your Care? No.  Actions: * If pain score is 4 or above: No action needed, pain <4.

## 2021-10-07 ENCOUNTER — Encounter: Payer: Self-pay | Admitting: Internal Medicine

## 2021-11-02 ENCOUNTER — Encounter: Payer: Self-pay | Admitting: Adult Health

## 2021-11-02 ENCOUNTER — Ambulatory Visit (INDEPENDENT_AMBULATORY_CARE_PROVIDER_SITE_OTHER): Payer: 59 | Admitting: Adult Health

## 2021-11-02 ENCOUNTER — Other Ambulatory Visit: Payer: Self-pay

## 2021-11-02 DIAGNOSIS — F331 Major depressive disorder, recurrent, moderate: Secondary | ICD-10-CM

## 2021-11-02 DIAGNOSIS — F411 Generalized anxiety disorder: Secondary | ICD-10-CM

## 2021-11-02 DIAGNOSIS — F429 Obsessive-compulsive disorder, unspecified: Secondary | ICD-10-CM | POA: Diagnosis not present

## 2021-11-02 NOTE — Progress Notes (Signed)
MORTON SIMSON [id number] Nov 15, 1966 55 y.o.  Subjective:   Patient ID:  John Wu is a 55 y.o. (DOB 09/19/1966) male.  Chief Complaint: No chief complaint on file.   HPI John Wu presents to the office today for follow-up of GAD, MDD, and OCD.  Describes mood today as "ok". Pleasant. Mood symptoms - denies depression, anxiety and irritability. Reports  decreased worry and rumination. Feels like medication continues to work well. Has decreased Prozac from 30mg  to 20mg  daily. Family doing well. Stable interest and motivation. Taking medications as prescribed. Energy levels stable. Active, has a regular exercise routine.  Enjoys some usual interests and activities. Married. Lives with wife of  30+ years. Has 2 daughters age 75 and 32. Spending time with family.  Appetite adequate. Weight stable - 185 pounds.   Sleeps well most nights. Averages 7 to 8 hours. Focus and concentration stable. Completing tasks. Managing aspects of household. Work going well. 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  Denies SI or HI.  Denies AH or VH.  Previous medications: Lexapro - groggy, Wellbutrin SR 150mg    GAD-7    Flowsheet Row Office Visit from 01/22/2021 in [facility] Office Visit from 02/18/2020 in [facility] Office Visit from 11/01/2019 in [facility]  Total GAD-7 Score 14 0 11      PHQ2-9    [facility] Office Visit from 06/18/2021 in [facility] Visit from 01/22/2021 in [facility] Visit from 10/11/2018 in [facility] Visit from 09/14/2017 in [facility]  PHQ-2 Total Score 0 1 0 0  PHQ-9 Total Score 0 3 -- --      [place] ED from 01/04/2021 in [facility] [place] No Risk        Review of Systems:  Review of Systems  Musculoskeletal:  Negative for gait problem.  Neurological:   Negative for tremors.  Psychiatric/Behavioral:         Please refer to HPI   Medications: I have reviewed the patient's current medications.  Current Outpatient Medications  Medication Sig Dispense Refill   atorvastatin (LIPITOR) 10 MG tablet Take 1 tablet (10 mg total) by mouth daily. 90 tablet 2   FLUoxetine (PROZAC) 20 MG capsule Take 1 capsule (20 mg total) by mouth daily. 90 capsule 3   levothyroxine (SYNTHROID) 112 MCG tablet Take 1 tablet (112 mcg total) by mouth daily. 90 tablet 3   OVER THE COUNTER MEDICATION Fish oil 3 daily Xidra eye drops bid     No current facility-administered medications for this visit.    Medication Side Effects: None  Allergies:  Allergies  Allergen Reactions   Lexapro [Escitalopram Oxalate] Other (See Comments)    grogginess    Past Medical History:  Diagnosis Date   Anxiety    Hyperlipidemia 06/18/2021   Hypothyroidism    IBS (irritable bowel syndrome)     Past Medical History, Surgical history, Social history, and Family history were reviewed and updated as appropriate.   Please see review of systems for further details on the patient's review from today.   Objective:   Physical Exam:  There were no vitals taken for this visit.  Physical Exam Constitutional:      General: He is not in acute distress. Musculoskeletal:        General: No deformity.  Neurological:     Mental Status: He is alert and  oriented to person, place, and time.     Coordination: Coordination normal.  Psychiatric:        Attention and Perception: Attention and perception normal. He does not perceive auditory or visual hallucinations.        Mood and Affect: Mood normal. Mood is not anxious or depressed. Affect is not labile, blunt, angry or inappropriate.        Speech: Speech normal.        Behavior: Behavior normal.        Thought Content: Thought content normal. Thought content is not paranoid or delusional. Thought content does not include homicidal or  suicidal ideation. Thought content does not include homicidal or suicidal plan.        Cognition and Memory: Cognition and memory normal.        Judgment: Judgment normal.     Comments: Insight intact    Lab Review:     Component Value Date/Time   NA 143 01/15/2021 0842   K 4.2 01/15/2021 0842   CL 103 01/15/2021 0842   CO2 25 01/15/2021 0842   GLUCOSE 104 (H) 01/15/2021 0842   GLUCOSE 116 (H) 12/18/2019 1658   BUN 19 01/15/2021 0842   CREATININE 1.13 01/15/2021 0842   CREATININE 1.07 11/01/2014 0756   CALCIUM 9.8 01/15/2021 0842   PROT 6.3 01/15/2021 0842   ALBUMIN 4.7 01/15/2021 0842   AST 20 01/15/2021 0842   ALT 31 01/15/2021 0842   ALKPHOS 72 01/15/2021 0842   BILITOT 0.8 01/15/2021 0842   GFRNONAA 72 01/15/2021 0842   GFRAA 84 01/15/2021 0842       Component Value Date/Time   WBC 7.3 01/15/2021 0842   WBC 8.4 11/19/2019 2115   RBC 5.19 01/15/2021 0842   RBC 5.28 11/19/2019 2115   HGB 15.6 01/15/2021 0842   HCT 45.8 01/15/2021 0842   PLT 251 01/15/2021 0842   MCV 88 01/15/2021 0842   MCH 30.1 01/15/2021 0842   MCH 30.1 11/19/2019 2115   MCHC 34.1 01/15/2021 0842   MCHC 35.1 11/19/2019 2115   RDW 12.6 01/15/2021 0842   LYMPHSABS 1.8 01/15/2021 0842   EOSABS 0.8 (H) 01/15/2021 0842   BASOSABS 0.1 01/15/2021 0842    No results found for: POCLITH, LITHIUM   No results found for: PHENYTOIN, PHENOBARB, VALPROATE, CBMZ   .res Assessment: Plan:    Plan:  PDMP reviewed  1. Prozac 30mg  to 20mg  daily 2. Ativan 0.5mg  daily prn  RTC 6 months  Patient advised to contact office with any questions, adverse effects, or acute worsening in signs and symptoms.  Discussed potential benefits, risk, and side effects of benzodiazepines to include potential risk of tolerance and dependence, as well as possible drowsiness.  Advised patient not to drive if experiencing drowsiness and to take lowest possible effective dose to minimize risk of dependence and  tolerance.   Diagnoses and all orders for this visit:  Obsessive-compulsive disorder, unspecified type  Major depressive disorder, recurrent episode, moderate ([place])  Generalized anxiety disorder    Please see After Visit Summary for patient specific instructions.  Future Appointments  Date Time Provider [facility]  05/03/2022  9:00 AM Emberlie Gotcher, Berdie Ogren, NP [facility] None    No orders of the defined types were placed in this encounter.   -------------------------------

## 2021-12-07 ENCOUNTER — Other Ambulatory Visit: Payer: Self-pay | Admitting: Family Medicine

## 2022-01-10 ENCOUNTER — Other Ambulatory Visit: Payer: Self-pay | Admitting: Adult Health

## 2022-01-10 DIAGNOSIS — F411 Generalized anxiety disorder: Secondary | ICD-10-CM

## 2022-01-10 DIAGNOSIS — F429 Obsessive-compulsive disorder, unspecified: Secondary | ICD-10-CM

## 2022-01-10 DIAGNOSIS — F331 Major depressive disorder, recurrent, moderate: Secondary | ICD-10-CM

## 2022-01-11 ENCOUNTER — Telehealth: Payer: Self-pay | Admitting: Family Medicine

## 2022-01-11 NOTE — Telephone Encounter (Signed)
Patient schedule physical on 2/14 and needing labs done

## 2022-01-11 NOTE — Telephone Encounter (Signed)
Last labs 01/2021: CMP, PSA, Liver, Lipid, CBC, TSH

## 2022-01-12 ENCOUNTER — Other Ambulatory Visit: Payer: Self-pay

## 2022-01-12 DIAGNOSIS — Z0000 Encounter for general adult medical examination without abnormal findings: Secondary | ICD-10-CM

## 2022-01-12 DIAGNOSIS — E039 Hypothyroidism, unspecified: Secondary | ICD-10-CM

## 2022-01-12 NOTE — Telephone Encounter (Signed)
Patient has been informed per lab orders 

## 2022-01-12 NOTE — Telephone Encounter (Signed)
CBC, lipid, liver, metabolic 7, TSH, free T4, PSA Wellness, hypothyroidism

## 2022-01-21 LAB — TSH+FREE T4
Free T4: 1.23 ng/dL (ref 0.82–1.77)
TSH: 1.89 u[IU]/mL (ref 0.450–4.500)

## 2022-01-21 LAB — CBC WITH DIFFERENTIAL/PLATELET
Basophils Absolute: 0.1 10*3/uL (ref 0.0–0.2)
Basos: 1 %
EOS (ABSOLUTE): 0.3 10*3/uL (ref 0.0–0.4)
Eos: 5 %
Hematocrit: 45.7 % (ref 37.5–51.0)
Hemoglobin: 15.7 g/dL (ref 13.0–17.7)
Immature Grans (Abs): 0 10*3/uL (ref 0.0–0.1)
Immature Granulocytes: 0 %
Lymphocytes Absolute: 1.7 10*3/uL (ref 0.7–3.1)
Lymphs: 32 %
MCH: 30.3 pg (ref 26.6–33.0)
MCHC: 34.4 g/dL (ref 31.5–35.7)
MCV: 88 fL (ref 79–97)
Monocytes Absolute: 0.4 10*3/uL (ref 0.1–0.9)
Monocytes: 7 %
Neutrophils Absolute: 2.9 10*3/uL (ref 1.4–7.0)
Neutrophils: 55 %
Platelets: 227 10*3/uL (ref 150–450)
RBC: 5.19 x10E6/uL (ref 4.14–5.80)
RDW: 12.9 % (ref 11.6–15.4)
WBC: 5.3 10*3/uL (ref 3.4–10.8)

## 2022-01-21 LAB — COMPREHENSIVE METABOLIC PANEL
ALT: 38 IU/L (ref 0–44)
AST: 30 IU/L (ref 0–40)
Albumin/Globulin Ratio: 2.6 — ABNORMAL HIGH (ref 1.2–2.2)
Albumin: 4.5 g/dL (ref 3.8–4.9)
Alkaline Phosphatase: 79 IU/L (ref 44–121)
BUN/Creatinine Ratio: 14 (ref 9–20)
BUN: 16 mg/dL (ref 6–24)
Bilirubin Total: 0.7 mg/dL (ref 0.0–1.2)
CO2: 27 mmol/L (ref 20–29)
Calcium: 9.8 mg/dL (ref 8.7–10.2)
Chloride: 101 mmol/L (ref 96–106)
Creatinine, Ser: 1.14 mg/dL (ref 0.76–1.27)
Globulin, Total: 1.7 g/dL (ref 1.5–4.5)
Glucose: 104 mg/dL — ABNORMAL HIGH (ref 70–99)
Potassium: 4.4 mmol/L (ref 3.5–5.2)
Sodium: 140 mmol/L (ref 134–144)
Total Protein: 6.2 g/dL (ref 6.0–8.5)
eGFR: 75 mL/min/{1.73_m2} (ref >59)

## 2022-01-21 LAB — HEPATIC FUNCTION PANEL: Bilirubin, Direct: 0.19 mg/dL (ref 0.00–0.40)

## 2022-01-21 LAB — LIPID PANEL
Chol/HDL Ratio: 3.1 ratio (ref 0.0–5.0)
Cholesterol, Total: 126 mg/dL (ref 100–199)
HDL: 41 mg/dL (ref >39)
LDL Chol Calc (NIH): 64 mg/dL (ref 0–99)
Triglycerides: 112 mg/dL (ref 0–149)
VLDL Cholesterol Cal: 21 mg/dL (ref 5–40)

## 2022-01-21 LAB — PSA: Prostate Specific Ag, Serum: 1 ng/mL (ref 0.0–4.0)

## 2022-01-26 ENCOUNTER — Ambulatory Visit (INDEPENDENT_AMBULATORY_CARE_PROVIDER_SITE_OTHER): Payer: 59 | Admitting: Family Medicine

## 2022-01-26 ENCOUNTER — Other Ambulatory Visit: Payer: Self-pay

## 2022-01-26 VITALS — BP 121/83 | HR 76 | Temp 98.8°F | Ht 68.0 in | Wt 198.6 lb

## 2022-01-26 DIAGNOSIS — Z0000 Encounter for general adult medical examination without abnormal findings: Secondary | ICD-10-CM

## 2022-01-26 DIAGNOSIS — E039 Hypothyroidism, unspecified: Secondary | ICD-10-CM | POA: Diagnosis not present

## 2022-01-26 DIAGNOSIS — E7800 Pure hypercholesterolemia, unspecified: Secondary | ICD-10-CM

## 2022-01-26 DIAGNOSIS — E785 Hyperlipidemia, unspecified: Secondary | ICD-10-CM | POA: Diagnosis not present

## 2022-01-26 MED ORDER — LEVOTHYROXINE SODIUM 112 MCG PO TABS: 112.0000 ug | ORAL_TABLET | Freq: Every day | ORAL | 3 refills | Status: DC

## 2022-01-26 MED ORDER — ATORVASTATIN CALCIUM 10 MG PO TABS: 10.0000 mg | ORAL_TABLET | Freq: Every day | ORAL | 3 refills | Status: DC

## 2022-01-26 NOTE — Patient Instructions (Signed)

## 2022-01-26 NOTE — Progress Notes (Signed)
° °  Subjective:    Patient ID: John Wu, male    DOB: 01/18/1966, 56 y.o.   MRN: [id number]  HPI  The patient comes in today for a wellness visit.    A review of their health history was completed.  A review of medications was also completed.  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  Specialist pt sees on regular basis: Yes, Dr. Rollene Fare Mozingo-Psychiatrist   Preventative health issues were discussed.  Health assessment questionnaire reviewed Additional concerns: None   Review of Systems     Objective:   Physical Exam  General-in no acute distress Eyes-no discharge Lungs-respiratory rate normal, CTA CV-no murmurs,RRR Extremities skin warm dry no edema Neuro grossly normal Behavior normal, alert Prostate exam normal      Assessment & Plan:  Adult wellness-complete.wellness physical was conducted today. Importance of diet and exercise were discussed in detail.  In addition to this a discussion regarding safety was also covered. We also reviewed over immunizations and gave recommendations regarding current immunization needed for age.  In addition to this additional areas were also touched on including: Preventative health exams needed:  Colonoscopy up-to-date  Patient was advised yearly wellness exam Labs reviewed in detail Continue thyroid medicine 1 year given Continue cholesterol medicine 1 year given Follow through with psychiatrist on regular basis Follow-up here if any problems

## 2022-03-18 ENCOUNTER — Other Ambulatory Visit: Payer: Self-pay | Admitting: Adult Health

## 2022-03-18 DIAGNOSIS — F331 Major depressive disorder, recurrent, moderate: Secondary | ICD-10-CM

## 2022-03-18 DIAGNOSIS — F411 Generalized anxiety disorder: Secondary | ICD-10-CM

## 2022-03-18 DIAGNOSIS — F429 Obsessive-compulsive disorder, unspecified: Secondary | ICD-10-CM

## 2022-04-30 ENCOUNTER — Ambulatory Visit (INDEPENDENT_AMBULATORY_CARE_PROVIDER_SITE_OTHER): Payer: 59

## 2022-04-30 ENCOUNTER — Ambulatory Visit (INDEPENDENT_AMBULATORY_CARE_PROVIDER_SITE_OTHER): Payer: 59 | Admitting: Orthopedic Surgery

## 2022-04-30 ENCOUNTER — Encounter: Payer: Self-pay | Admitting: Orthopedic Surgery

## 2022-04-30 DIAGNOSIS — M25562 Pain in left knee: Secondary | ICD-10-CM

## 2022-04-30 DIAGNOSIS — M1712 Unilateral primary osteoarthritis, left knee: Secondary | ICD-10-CM

## 2022-04-30 DIAGNOSIS — M1710 Unilateral primary osteoarthritis, unspecified knee: Secondary | ICD-10-CM

## 2022-04-30 NOTE — Patient Instructions (Signed)
Ice knee several times today  Go buy a [employer] shirt :)   Call us if you need [place].

## 2022-04-30 NOTE — Progress Notes (Signed)
New patient last seen January 2020  Chief complaint left knee pain  This is a 56 year old male presents with atraumatic onset of left knee pain associated with aching unrelieved by occasional naproxen.  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  Nothing that would suggest any radicular pathology.  I reviewed his medical history surgical history social history family history  Nothing to and I would contribute to his present problem  Focused examination.  He is awake alert and oriented x3 mood and affect are normal  His gait is nonantalgic  His left knee and right knee looks similar in terms of there being no effusion or swelling.  He bends his left knee normally he has mild tenderness over the medial and lateral femoral condyles and mild tenderness at the medial meniscal junction the patella has some crepitance in it and mild tenderness under the medial and lateral facet but is stable.  The ACL and PCL ligaments were stable as well as the collateral ligaments and muscle tone was normal skin was intact with no rash or erythema  X-rays showed mild grade 2 arthritis of the knee by khellin criteria  Differential diagnosis  Osteoarthritis Medial meniscus tear  We discussed oral arthritis medicine versus cortisone injection  We decided to go with a cortisone injection to see if this would give him long-term relief and if not we can always go to the anti-inflammatories realizing that there are some inherent risk of taking those although they are rare  Procedure  Left knee injection: The patient was amenable to left knee injection.  We confirmed the site and medications of Depo-Medrol 40 mg lidocaine 1% 3 cc  We prepped the knee with alcohol and ethyl chloride and injected from a lateral approach with the knee in 90 degrees of flexion without complication

## 2022-05-03 ENCOUNTER — Ambulatory Visit: Payer: 59 | Admitting: Adult Health

## 2022-05-03 ENCOUNTER — Encounter: Payer: Self-pay | Admitting: Adult Health

## 2022-05-03 DIAGNOSIS — F429 Obsessive-compulsive disorder, unspecified: Secondary | ICD-10-CM | POA: Diagnosis not present

## 2022-05-03 DIAGNOSIS — F331 Major depressive disorder, recurrent, moderate: Secondary | ICD-10-CM | POA: Diagnosis not present

## 2022-05-03 DIAGNOSIS — F411 Generalized anxiety disorder: Secondary | ICD-10-CM

## 2022-05-03 MED ORDER — FLUOXETINE HCL 10 MG PO CAPS: ORAL_CAPSULE | ORAL | 3 refills | Status: DC

## 2022-05-03 NOTE — Progress Notes (Signed)
John Wu [id number] 08-30-1966 55 y.o.  Subjective:   Patient ID:  John Wu is a 56 y.o. (DOB 1966/07/21) male.  Chief Complaint: No chief complaint on file.   HPI John Wu presents to the office today for follow-up of GAD, MDD and OCD.  Describes mood today as "ok". Pleasant. Mood symptoms - denies depression, anxiety and irritability. Reports  decreased worry and rumination. Mood is consistent. Stating "I'm doing alright". Would like to decrease Prozac from '20mg'$  to '10mg'$  daily. Family doing well. Stable interest and motivation. Taking medications as prescribed. Energy levels stable. Active, has a regular exercise routine.  Enjoys some usual interests and activities. Married. Lives with wife - 2 daughters. Spending time with family.  Appetite adequate. Weight stable - 185 pounds.   Sleeps well most nights. Averages 7 to 8 hours. Focus and concentration stable. Completing tasks. Managing aspects of household. Work going well. 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  Denies SI or HI.  Denies AH or VH.  Previous medications: Lexapro - groggy, Wellbutrin SR '150mg'$    GAD-7    Flowsheet Row Office Visit from 01/22/2021 in [place] Office Visit from 02/18/2020 in [place] Office Visit from 11/01/2019 in [place]  Total GAD-7 Score 14 0 11      PHQ2-9    [place] Office Visit from 06/18/2021 in [place] Visit from 01/22/2021 in [place] Visit from 10/11/2018 in [place] Visit from 09/14/2017 in [place]  PHQ-2 Total Score 0 1 0 0  PHQ-9 Total Score 0 3 -- --      [place] [facility] from 01/04/2021 in [facility] [place] No Risk        Review of Systems:  Review of Systems  Musculoskeletal:  Negative for gait problem.  Neurological:  Negative for tremors.   Psychiatric/Behavioral:         Please refer to HPI   Medications: I have reviewed the patient's current medications.  Current Outpatient Medications  Medication Sig Dispense Refill   atorvastatin (LIPITOR) 10 MG tablet Take 1 tablet (10 mg total) by mouth daily. 90 tablet 3   FLUoxetine (PROZAC) 20 MG capsule TAKE 1 CAPSULE BY MOUTH  DAILY 90 capsule 3   levothyroxine (SYNTHROID) 112 MCG tablet Take 1 tablet (112 mcg total) by mouth daily. 90 tablet 3   OVER THE COUNTER MEDICATION Fish oil 3 daily Xidra eye drops bid     No current facility-administered medications for this visit.    Medication Side Effects: None  Allergies:  Allergies  Allergen Reactions   Lexapro [Escitalopram Oxalate] Other (See Comments)    grogginess    Past Medical History:  Diagnosis Date   Anxiety    Hyperlipidemia 06/18/2021   Hypothyroidism    IBS (irritable bowel syndrome)     Past Medical History, Surgical history, Social history, and Family history were reviewed and updated as appropriate.   Please see review of systems for further details on the patient's review from today.   Objective:   Physical Exam:  There were no vitals taken for this visit.  Physical Exam Constitutional:      General: He is not in acute distress. Musculoskeletal:        General: No deformity.  Neurological:     Mental Status: He is alert and oriented to person, place, and time.  Coordination: Coordination normal.  Psychiatric:        Attention and Perception: Attention and perception normal. He does not perceive auditory or visual hallucinations.        Mood and Affect: Mood normal. Mood is not anxious or depressed. Affect is not labile, blunt, angry or inappropriate.        Speech: Speech normal.        Behavior: Behavior normal.        Thought Content: Thought content normal. Thought content is not paranoid or delusional. Thought content does not include homicidal or suicidal ideation. Thought content  does not include homicidal or suicidal plan.        Cognition and Memory: Cognition and memory normal.        Judgment: Judgment normal.     Comments: Insight intact    Lab Review:     Component Value Date/Time   NA 140 01/20/2022 0830   K 4.4 01/20/2022 0830   CL 101 01/20/2022 0830   CO2 27 01/20/2022 0830   GLUCOSE 104 (H) 01/20/2022 0830   GLUCOSE 116 (H) 12/18/2019 1658   BUN 16 01/20/2022 0830   CREATININE 1.14 01/20/2022 0830   CREATININE 1.07 11/01/2014 0756   CALCIUM 9.8 01/20/2022 0830   PROT 6.2 01/20/2022 0830   ALBUMIN 4.5 01/20/2022 0830   AST 30 01/20/2022 0830   ALT 38 01/20/2022 0830   ALKPHOS 79 01/20/2022 0830   BILITOT 0.7 01/20/2022 0830   GFRNONAA 72 01/15/2021 0842   GFRAA 84 01/15/2021 0842       Component Value Date/Time   WBC 5.3 01/20/2022 0830   WBC 8.4 11/19/2019 2115   RBC 5.19 01/20/2022 0830   RBC 5.28 11/19/2019 2115   HGB 15.7 01/20/2022 0830   HCT 45.7 01/20/2022 0830   PLT 227 01/20/2022 0830   MCV 88 01/20/2022 0830   MCH 30.3 01/20/2022 0830   MCH 30.1 11/19/2019 2115   MCHC 34.4 01/20/2022 0830   MCHC 35.1 11/19/2019 2115   RDW 12.9 01/20/2022 0830   LYMPHSABS 1.7 01/20/2022 0830   EOSABS 0.3 01/20/2022 0830   BASOSABS 0.1 01/20/2022 0830    No results found for: POCLITH, LITHIUM   No results found for: PHENYTOIN, PHENOBARB, VALPROATE, CBMZ   .res Assessment: Plan:    Plan:  PDMP reviewed  1. Decrease Prozac '20mg'$  to '10mg'$  daily 2. Ativan 0.'5mg'$  daily prn  RTC 6 months  Patient advised to contact office with any questions, adverse effects, or acute worsening in signs and symptoms.  Discussed potential benefits, risk, and side effects of benzodiazepines to include potential risk of tolerance and dependence, as well as possible drowsiness.  Advised patient not to drive if experiencing drowsiness and to take lowest possible effective dose to minimize risk of dependence and tolerance.  There are no diagnoses linked  to this encounter.   Please see After Visit Summary for patient specific instructions.  Future Appointments  Date Time Provider [place]  06/14/2022  1:15 PM Josue Hector, MD [facility]  01/27/2023  9:00 AM Kathyrn Drown, MD [facility]    No orders of the defined types were placed in this encounter.   -------------------------------

## 2022-06-09 NOTE — Progress Notes (Signed)
CARDIOLOGY CONSULT NOTE       Patient ID: John Wu MRN: [id number] DOB/AGE: [birth year] 58 y.o.  Admit date: (Not on file) Referring Physician: [name] [facility] Primary Physician: Kathyrn Drown, MD Primary Cardiologist: [name]  Reason for F/U:  Chest Pain   HPI: 56 y.o. with history of hypothyroidism, IBS, Anxiety and atypical chest pain Seen [facility] ED 11/19/19 r/o normal ECG and CXR. He is on Prozac and sees a therapist. 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  He is coaching baseball for [employer] is Software engineer at [employer] and has daughter who is becoming one Whole family went to [facility]   Calcium score done  06/13/20 was 17 58 th percentile Started on statin LDL 64 01/20/22  One daughter in [place] looking for a job One in [place] working as a Software engineer at [employer]  He was kind enough to bring Me [employer] baseball card from [place] All other systems reviewed and negative except as noted above  Past Medical History:  Diagnosis Date   Anxiety    Hyperlipidemia 06/18/2021   Hypothyroidism    IBS (irritable bowel syndrome)     Family History  Problem Relation Age of Onset   Hypertension Father    Heart attack Father    Lung cancer Father    Benign prostatic hyperplasia Brother    Colon cancer Neg Hx    Colon polyps Neg Hx    Esophageal cancer Neg Hx    Stomach cancer Neg Hx    Rectal cancer Neg Hx     Social History   Socioeconomic History   Marital status: Married    Spouse name: Monica   Number of children: 2   Years of education: Not on file   Highest education level: Not on file  Occupational History   Not on file  Tobacco Use   Smoking status: Never   Smokeless tobacco: Never  Vaping Use   Vaping Use: Never used  Substance and Sexual Activity   Alcohol use: No   Drug use: No   Sexual activity: Yes  Other Topics Concern   Not on file  Social History Narrative    Not on file   Social Determinants of Health   Financial Resource Strain: Not on file  Food Insecurity: Not on file  Transportation Needs: Not on file  Physical Activity: Not on file  Stress: Not on file  Social Connections: Not on file  Intimate Partner Violence: Not on file    Past Surgical History:  Procedure Laterality Date   CHOLECYSTECTOMY  12/13/2005   COLOSTOMY     VASECTOMY  12/13/1998      Physical Exam: Blood pressure 122/80, pulse 67, height '5\' 10"'$  (1.778 m), weight 198 lb 9.6 oz (90.1 kg), SpO2 96 %.    Affect appropriate Healthy:  appears stated age 24: normal Neck supple with no adenopathy JVP normal no bruits no thyromegaly Lungs clear with no wheezing and good diaphragmatic motion Heart:  S1/S2 no murmur, no rub, gallop or click PMI normal Abdomen: benighn, BS positve, no tenderness, no AAA no bruit.  No HSM or HJR Distal pulses intact with no bruits No edema Neuro non-focal Skin warm and dry No muscular weakness   Labs:   Lab Results  Component Value Date   WBC 5.3 01/20/2022   HGB 15.7 01/20/2022   HCT  45.7 01/20/2022   MCV 88 01/20/2022   PLT 227 01/20/2022   No results for input(s): "NA", "K", "CL", "CO2", "BUN", "CREATININE", "CALCIUM", "PROT", "BILITOT", "ALKPHOS", "ALT", "AST", "GLUCOSE" in the last 168 hours.  Invalid input(s): "LABALBU" No results found for: "CKTOTAL", "CKMB", "CKMBINDEX", "TROPONINI"  Lab Results  Component Value Date   CHOL 126 01/20/2022   CHOL 127 01/15/2021   CHOL 135 09/16/2020   Lab Results  Component Value Date   HDL 41 01/20/2022   HDL 47 01/15/2021   HDL 41 09/16/2020   Lab Results  Component Value Date   LDLCALC 64 01/20/2022   LDLCALC 64 01/15/2021   LDLCALC 75 09/16/2020   Lab Results  Component Value Date   TRIG 112 01/20/2022   TRIG 78 01/15/2021   TRIG 100 09/16/2020   Lab Results  Component Value Date   CHOLHDL 3.1 01/20/2022   CHOLHDL 2.7 01/15/2021   CHOLHDL 3.3 09/16/2020    No results found for: "LDLDIRECT"    Radiology: No results found.  EKG: 11/20/19 NSR normal ECG rate 78 bpm   ASSESSMENT AND PLAN:   1. Chest Pain:  Atypical normal labs ECG and CXR  Calcium score 06/14/20 Was 30 91 th percentile for age Started on statin 04/06/21 as LDL was 118 now improved to 64 on Lipitor 10 mg   2. Thyroid:  On replacement TSH normal 01/20/22   3. Anxiety: major issue causing somatic issues On Prozac and Ativan  f/u with Dr Wolfgang Phoenix and therapist Seems improved to stable based on last note 04/30/21 from [facility] NP   F/U in a year   Signed: Jenkins Rouge 06/14/2022, 1:16 PM

## 2022-06-14 ENCOUNTER — Ambulatory Visit (INDEPENDENT_AMBULATORY_CARE_PROVIDER_SITE_OTHER): Payer: 59 | Admitting: Cardiovascular Disease

## 2022-06-14 ENCOUNTER — Encounter: Payer: Self-pay | Admitting: Cardiovascular Disease

## 2022-06-14 VITALS — BP 122/80 | HR 67 | Ht 70.0 in | Wt 198.6 lb

## 2022-06-14 DIAGNOSIS — R079 Chest pain, unspecified: Secondary | ICD-10-CM

## 2022-06-14 DIAGNOSIS — E7800 Pure hypercholesterolemia, unspecified: Secondary | ICD-10-CM

## 2022-06-14 NOTE — Patient Instructions (Signed)
Medication Instructions:  ?Your physician recommends that you continue on your current medications as directed. Please refer to the Current Medication list given to you today. ? ?*If you need a refill on your cardiac medications before your next appointment, please call your pharmacy* ? ? ?Lab Work: ?NONE  ? ?If you have labs (blood work) drawn today and your tests are completely normal, you will receive your results only by: ?MyChart Message (if you have MyChart) OR ?A paper copy in the mail ?If you have any lab test that is abnormal or we need to change your treatment, we will call you to review the results. ? ? ?Testing/Procedures: ?NONE  ? ? ?Follow-Up: ?At [facility], you and your health needs are our priority.  As part of our continuing mission to provide you with exceptional heart care, we have created designated Provider Care Teams.  These Care Teams include your primary Cardiologist (physician) and Advanced Practice Providers (APPs -  Physician Assistants and Nurse Practitioners) who all work together to provide you with the care you need, when you need it. ? ?We recommend signing up for the patient portal called "MyChart".  Sign up information is provided on this After Visit Summary.  MyChart is used to connect with patients for Virtual Visits (Telemedicine).  Patients are able to view lab/test results, encounter notes, upcoming appointments, etc.  Non-urgent messages can be sent to your provider as well.   ?To learn more about what you can do with MyChart, go to [email or web address].   ? ?Your next appointment:   ?1 year(s) ? ?The format for your next appointment:   ?In Person ? ?Provider:   ?Peter Nishan, MD  ? ? ?Other Instructions ?Thank you for choosing [facility]! ? ? ? ?Important Information About Sugar ? ? ? ? ? ? ?

## 2022-09-06 ENCOUNTER — Encounter (HOSPITAL_COMMUNITY): Payer: Self-pay | Admitting: Emergency Medicine

## 2022-09-06 ENCOUNTER — Emergency Department (HOSPITAL_COMMUNITY): Payer: 59

## 2022-09-06 ENCOUNTER — Emergency Department (HOSPITAL_COMMUNITY)
Admission: EM | Admit: 2022-09-06 | Discharge: 2022-09-06 | Disposition: A | Discharge status: Home / Self Care | Payer: 59 | Attending: Emergency Medicine | Admitting: Emergency Medicine

## 2022-09-06 ENCOUNTER — Telehealth: Payer: Self-pay | Admitting: Radiology

## 2022-09-06 ENCOUNTER — Other Ambulatory Visit: Payer: Self-pay

## 2022-09-06 DIAGNOSIS — X500XXA Overexertion from strenuous movement or load, initial encounter: Secondary | ICD-10-CM | POA: Insufficient documentation

## 2022-09-06 DIAGNOSIS — S8264XA Nondisplaced fracture of lateral malleolus of right fibula, initial encounter for closed fracture: Secondary | ICD-10-CM | POA: Diagnosis not present

## 2022-09-06 DIAGNOSIS — S99911A Unspecified injury of right ankle, initial encounter: Secondary | ICD-10-CM | POA: Diagnosis present

## 2022-09-06 DIAGNOSIS — Y93I9 Activity, other involving external motion: Secondary | ICD-10-CM | POA: Insufficient documentation

## 2022-09-06 MED ORDER — OXYCODONE-ACETAMINOPHEN 5-325 MG PO TABS
1.0000 | ORAL_TABLET | Freq: Once | ORAL | Status: AC
  Administered 2022-09-06: 1 via ORAL
  Filled 2022-09-06: qty 1

## 2022-09-06 NOTE — ED Provider Notes (Signed)
[facility] EMERGENCY DEPARTMENT Provider Note   CSN: [id number] Arrival date & time: 09/06/22  1702     History  Chief Complaint  Patient presents with   Ankle Pain    NELS MUNN is a 56 y.o. male.   Ankle Pain  Patient is a 56 year old gentleman presented emergency room today with right ankle pain.  He states that he injured his ankle prior to arrival when he squatted down and heard/felt a pop in his right ankle.  He states that he had sudden onset of pain he states that he did not have any fall or head injury or loss of consciousness.  Denies any numbness or weakness.  Denies any other areas of pain.  No knee pain.      Home Medications Prior to Admission medications   Medication Sig Start Date End Date Taking? Authorizing Provider  atorvastatin (LIPITOR) 10 MG tablet Take 1 tablet (10 mg total) by mouth daily. 01/26/22   Kathyrn Drown, MD  FLUoxetine (PROZAC) 10 MG capsule Take one capsule daily. 05/03/22   Mozingo, Berdie Ogren, NP  levothyroxine (SYNTHROID) 112 MCG tablet Take 1 tablet (112 mcg total) by mouth daily. 01/26/22   Kathyrn Drown, MD  OVER THE COUNTER MEDICATION Fish oil 3 daily Xidra eye drops bid    [provider]      Allergies    Lexapro [escitalopram oxalate]    Review of Systems   Review of Systems  Physical Exam Updated Vital Signs BP 124/84 (BP Location: Left Arm)   Pulse 77   Temp 98.4 F (36.9 C) (Oral)   Resp 20   Ht '5\' 10"'$  (1.778 m)   Wt 90.1 kg   SpO2 100%   BMI 28.50 kg/m  Physical Exam Vitals and nursing note reviewed.  Constitutional:      General: He is not in acute distress.    Appearance: Normal appearance. He is not ill-appearing.  HENT:     Head: Normocephalic and atraumatic.     Mouth/Throat:     Mouth: Mucous membranes are moist.  Eyes:     General: No scleral icterus.       Right eye: No discharge.        Left eye: No discharge.     Conjunctiva/sclera: Conjunctivae normal.   Cardiovascular:     Pulses: Normal pulses.  Pulmonary:     Effort: Pulmonary effort is normal.     Breath sounds: No stridor.  Musculoskeletal:     Comments: R ankle with lateral mal TTP DP PT pulses 3+  No other extremity tenderness.  No significant swelling or bruising.  Able to wiggle toes, cap refill less than 2 seconds  Skin:    General: Skin is warm and dry.  Neurological:     Mental Status: He is alert and oriented to person, place, and time. Mental status is at baseline.     ED Results / Procedures / Treatments   Labs (all labs ordered are listed, but only abnormal results are displayed) Labs Reviewed - No data to display  EKG None  Radiology DG Foot Complete Right  Result Date: 09/06/2022 CLINICAL DATA:  Fall EXAM: RIGHT FOOT COMPLETE - 3+ VIEW COMPARISON:  None Available. FINDINGS: Acute nondisplaced distal fibular fracture. No acute fracture or malalignment at the foot. Mild degenerative changes at the first MTP joint. IMPRESSION: Acute nondisplaced distal fibular fracture Electronically Signed   By: Donavan Foil M.D.   On: 09/06/2022 17:32  DG Ankle Complete Right  Result Date: 09/06/2022 CLINICAL DATA:  Fall with ankle injury EXAM: RIGHT ANKLE - COMPLETE 3+ VIEW COMPARISON:  None Available. FINDINGS: Acute nondisplaced distal fibular fracture. Ankle mortise is symmetric. Mild soft tissue swelling. IMPRESSION: Acute nondisplaced distal fibular fracture. Electronically Signed   By: Donavan Foil M.D.   On: 09/06/2022 17:30    Procedures Procedures    Medications Ordered in ED Medications  oxyCODONE-acetaminophen (PERCOCET/ROXICET) 5-325 MG per tablet 1 tablet (has no administration in time range)    ED Course/ Medical Decision Making/ A&P Clinical Course as of 09/06/22 1823  Mon Sep 06, 2022  1743 DG Foot Complete Right [HH]    Clinical Course User Index [HH] Aurther Loft, [place] Decision Making Amount and/or  Complexity of Data Reviewed Radiology: ordered.   Patient is a 56 year old gentleman presented emergency room today with right ankle pain.  He states that he injured his ankle prior to arrival when he squatted down and heard/felt a pop in his right ankle.  He states that he had sudden onset of pain he states that he did not have any fall or head injury or loss of consciousness.  Denies any numbness or weakness.  Denies any other areas of pain.  No knee pain.  X-ray of right foot and x-ray of right ankle ordered and reviewed.  Patient has distal fibular fracture.  This is nondisplaced.  I personally reviewed the x-ray and agree with radiology read.  We will place in a walking boot, patient already owns crutches and declines them here.  Tylenol and ibuprofen at home.  Final Clinical Impression(s) / ED Diagnoses Final diagnoses:  Closed nondisplaced fracture of lateral malleolus of right fibula, initial encounter    Rx / DC Orders ED Discharge Orders     None         Tedd Sias, [place] 09/06/22 1823    Varney Biles, MD 09/07/22 1654

## 2022-09-06 NOTE — Telephone Encounter (Signed)
Patient called here and said he thinks he just broke his ankle, and wanted to know where to go, what you thought.  I called him back and advised [facility], and he is at [facility] ED now checking in.  I told him I would let you know.

## 2022-09-06 NOTE — ED Triage Notes (Signed)
Pt to the ED with a right ankle injury. Pt states he heard a pop.  Patient is able to wiggle his toes and pedal pulse is intact.

## 2022-09-06 NOTE — Discharge Instructions (Signed)
Ice and elevate your ankle.  Tylenol ibuprofen as discussed below  Please use Tylenol or ibuprofen for pain.  You may use 600 mg ibuprofen every 6 hours or 1000 mg of Tylenol every 6 hours.  You may choose to alternate between the 2.  This would be most effective.  Not to exceed 4 g of Tylenol within 24 hours.  Not to exceed 3200 mg ibuprofen 24 hours.

## 2022-09-09 ENCOUNTER — Other Ambulatory Visit (HOSPITAL_COMMUNITY): Payer: Self-pay

## 2022-09-09 MED ORDER — INFLUENZA VAC SPLIT QUAD 0.5 ML IM SUSY
PREFILLED_SYRINGE | INTRAMUSCULAR | 0 refills | Status: AC
  Filled 2022-09-09: qty 0.5, 1d supply, fill #0

## 2022-09-10 ENCOUNTER — Ambulatory Visit (INDEPENDENT_AMBULATORY_CARE_PROVIDER_SITE_OTHER): Payer: 59 | Admitting: Orthopedic Surgery

## 2022-09-10 ENCOUNTER — Encounter: Payer: Self-pay | Admitting: Orthopedic Surgery

## 2022-09-10 VITALS — BP 118/78 | HR 69 | Ht 70.0 in | Wt 198.0 lb

## 2022-09-10 DIAGNOSIS — S8264XA Nondisplaced fracture of lateral malleolus of right fibula, initial encounter for closed fracture: Secondary | ICD-10-CM | POA: Diagnosis not present

## 2022-09-10 NOTE — Progress Notes (Signed)
Chief Complaint  Patient presents with   new problem    Closed nondisplaced fracture of lateral malleolus of right fibula DOI 09/06/22   ER follow-up  56 year old male slipped at his mom's pool complains of right fibular pain had x-rays in the ER that showed a nondisplaced fibular fracture  Patient does not have any medial injury  Comes in for follow-up  Review of systems is negative for chest pain shortness of breath numbness or tingling  Past Medical History:  Diagnosis Date   Anxiety    Hyperlipidemia 06/18/2021   Hypothyroidism    IBS (irritable bowel syndrome)     BP 118/78   Pulse 69   Ht '5\' 10"'$  (1.778 m)   Wt 198 lb (89.8 kg)   BMI 28.41 kg/m   He is awake alert and oriented x3  Mood affect normal  General appearance normal well-developed well-nourished well-groomed  He is on a knee wheeler he is in a boot  He has tenderness over the fibula none really medially none proximally none around the knee neurovascular motor exam is intact  I interpreted the x-ray  The x-ray shows a nondisplaced fibular fracture intact ankle mortise  Patient will be nonweightbearing for now in a cam walker  He can take the boot off to drive  Recommend x-ray 2 weeks post injury if the x-ray is normal he can start weightbearing in the boot

## 2022-09-20 ENCOUNTER — Encounter: Payer: Self-pay | Admitting: Orthopedic Surgery

## 2022-09-20 ENCOUNTER — Ambulatory Visit (INDEPENDENT_AMBULATORY_CARE_PROVIDER_SITE_OTHER): Payer: 59

## 2022-09-20 ENCOUNTER — Ambulatory Visit (INDEPENDENT_AMBULATORY_CARE_PROVIDER_SITE_OTHER): Payer: 59 | Admitting: Orthopedic Surgery

## 2022-09-20 DIAGNOSIS — S8264XA Nondisplaced fracture of lateral malleolus of right fibula, initial encounter for closed fracture: Secondary | ICD-10-CM | POA: Insufficient documentation

## 2022-09-20 DIAGNOSIS — S8264XD Nondisplaced fracture of lateral malleolus of right fibula, subsequent encounter for closed fracture with routine healing: Secondary | ICD-10-CM

## 2022-09-20 NOTE — Progress Notes (Signed)
Chief Complaint  Patient presents with   Ankle Injury    09/06/22 right ankle fracture    Xrays show frx is resolving   Pain is well controlled   Ok to wbat in the brace   4 weeks xroop

## 2022-10-14 ENCOUNTER — Other Ambulatory Visit (HOSPITAL_COMMUNITY): Payer: Self-pay

## 2022-10-18 ENCOUNTER — Ambulatory Visit (INDEPENDENT_AMBULATORY_CARE_PROVIDER_SITE_OTHER): Payer: 59 | Admitting: Orthopedic Surgery

## 2022-10-18 ENCOUNTER — Encounter: Payer: Self-pay | Admitting: Orthopedic Surgery

## 2022-10-18 ENCOUNTER — Ambulatory Visit (INDEPENDENT_AMBULATORY_CARE_PROVIDER_SITE_OTHER): Payer: 59

## 2022-10-18 DIAGNOSIS — S8264XD Nondisplaced fracture of lateral malleolus of right fibula, subsequent encounter for closed fracture with routine healing: Secondary | ICD-10-CM

## 2022-10-18 NOTE — Progress Notes (Signed)
FRACTURE CARE FOLLOW UP   Chief Complaint  Patient presents with   Right Ankle - Fracture    09/06/22 improving walking in boot     FOV: 09/10/22  DOI: 09/06/22  GLOBAL PERIOD: 09/10/22 to 12/10/22  Encounter Diagnosis  Name Primary?   Closed nondisplaced fracture of lateral malleolus of right fibula with routine healing, subsequent encounter Yes    TREATMENT: cam walker wbat   56 year old male slipped at his mom's pool complains of right fibular pain had x-rays in the ER that showed a nondisplaced fibular fracture   Patient does not have any medial injury    This is a 6-week follow-up for the nondisplaced lateral malleolus fracture of the right ankle  Patient complains of no pain or symptoms  His exam is unremarkable  His x-ray shows fracture healing  Plan is to have the patient resume normal activities avoid uneven ground follow-up as needed

## 2022-11-01 ENCOUNTER — Encounter: Payer: Self-pay | Admitting: Adult Health

## 2022-11-01 ENCOUNTER — Ambulatory Visit (INDEPENDENT_AMBULATORY_CARE_PROVIDER_SITE_OTHER): Payer: 59 | Admitting: Adult Health

## 2022-11-01 ENCOUNTER — Other Ambulatory Visit (HOSPITAL_COMMUNITY): Payer: Self-pay

## 2022-11-01 DIAGNOSIS — F411 Generalized anxiety disorder: Secondary | ICD-10-CM

## 2022-11-01 DIAGNOSIS — F429 Obsessive-compulsive disorder, unspecified: Secondary | ICD-10-CM

## 2022-11-01 DIAGNOSIS — F331 Major depressive disorder, recurrent, moderate: Secondary | ICD-10-CM

## 2022-11-01 MED ORDER — LORAZEPAM 0.5 MG PO TABS
0.5000 mg | ORAL_TABLET | Freq: Three times a day (TID) | ORAL | 2 refills | Status: DC
  Filled 2022-11-01: qty 30, 10d supply, fill #0

## 2022-11-01 MED ORDER — FLUOXETINE HCL 20 MG PO CAPS: 20.0000 mg | ORAL_CAPSULE | Freq: Every day | ORAL | 3 refills | Status: DC

## 2022-11-01 NOTE — Progress Notes (Signed)
John Wu [id number] 17-Aug-1966 56 y.o.  Subjective:   Patient ID:  John Wu is a 56 y.o. (DOB 1966/03/26) male.  Chief Complaint: No chief complaint on file.   HPI John Wu presents to the office today for follow-up of GAD, MDD and OCD.  Accompanied by wife.  Describes mood today as "ok". Pleasant. Mood symptoms - reports depression and anxiety. Reports some irritability. Reports increased worry and rumination. Reports increased situational stressors. Mood is consistent. Stating "I'm not doing as well as I was". Has increased Prozac from '10mg'$  to '20mg'$  daily - has been taking the '20mg'$  dose for approximately a week. Family doing well. Stable interest and motivation. Taking medications as prescribed. Energy levels stable. Active, has a regular exercise routine.  Enjoys some usual interests and activities. Married. Lives with wife - 2 daughters. Spending time with family.  Appetite decreased. Weight loss. Sleeps well most nights. Averages 7 hours. Focus and concentration stable. Completing tasks. Managing aspects of household. Work going well. 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  Denies SI or HI.  Denies AH or VH.  Previous medications: Lexapro - groggy, Wellbutrin SR '150mg'$     GAD-7    Flowsheet Row Office Visit from 01/22/2021 in [place] Office Visit from 02/18/2020 in [place] Office Visit from 11/01/2019 in [place]  Total GAD-7 Score 14 0 11      PHQ2-9    [place] Office Visit from 06/18/2021 in [place] Visit from 01/22/2021 in [place] Visit from 10/11/2018 in [place] Visit from 09/14/2017 in [place]  PHQ-2 Total Score 0 1 0 0  PHQ-9 Total Score 0 3 -- --      Flowsheet Row ED from 09/06/2022 in [place] ED from 01/04/2021 in [facility]  [place] No Risk No Risk        Review of Systems:  Review of Systems  Musculoskeletal:  Negative for gait problem.  Neurological:  Negative for tremors.  Psychiatric/Behavioral:         Please refer to HPI    Medications: I have reviewed the patient's current medications.  Current Outpatient Medications  Medication Sig Dispense Refill   LORazepam (ATIVAN) 0.5 MG tablet Take 1 tablet (0.5 mg total) by mouth every 8 (eight) hours. 30 tablet 2   atorvastatin (LIPITOR) 10 MG tablet Take 1 tablet (10 mg total) by mouth daily. 90 tablet 3   FLUoxetine (PROZAC) 10 MG capsule Take one capsule daily. 90 capsule 3   FLUoxetine (PROZAC) 20 MG capsule Take 1 capsule (20 mg total) by mouth daily. 90 capsule 3   influenza vac split quadrivalent PF (FLUARIX) 0.5 ML injection Inject 0.5 ml into the muscle. 0.5 mL 0   levothyroxine (SYNTHROID) 112 MCG tablet Take 1 tablet (112 mcg total) by mouth daily. 90 tablet 3   OVER THE COUNTER MEDICATION Fish oil 3 daily Xidra eye drops bid     XIIDRA 5 % SOLN Apply 1 drop to eye 2 (two) times daily.     No current facility-administered medications for this visit.    Medication Side Effects: None  Allergies:  Allergies  Allergen Reactions   Lexapro [Escitalopram Oxalate] Other (See Comments)    grogginess    Past Medical History:  Diagnosis Date   Anxiety    Hyperlipidemia 06/18/2021   Hypothyroidism  IBS (irritable bowel syndrome)     Past Medical History, Surgical history, Social history, and Family history were reviewed and updated as appropriate.   Please see review of systems for further details on the patient's review from today.   Objective:   Physical Exam:  There were no vitals taken for this visit.  Physical Exam Constitutional:      General: He is not in acute distress. Musculoskeletal:        General: No deformity.  Neurological:     Mental Status: He is alert and oriented to person, place, and  time.     Coordination: Coordination normal.  Psychiatric:        Attention and Perception: Attention and perception normal. He does not perceive auditory or visual hallucinations.        Mood and Affect: Mood normal. Mood is not anxious or depressed. Affect is not labile, blunt, angry or inappropriate.        Speech: Speech normal.        Behavior: Behavior normal.        Thought Content: Thought content normal. Thought content is not paranoid or delusional. Thought content does not include homicidal or suicidal ideation. Thought content does not include homicidal or suicidal plan.        Cognition and Memory: Cognition and memory normal.        Judgment: Judgment normal.     Comments: Insight intact     Lab Review:     Component Value Date/Time   NA 140 01/20/2022 0830   K 4.4 01/20/2022 0830   CL 101 01/20/2022 0830   CO2 27 01/20/2022 0830   GLUCOSE 104 (H) 01/20/2022 0830   GLUCOSE 116 (H) 12/18/2019 1658   BUN 16 01/20/2022 0830   CREATININE 1.14 01/20/2022 0830   CREATININE 1.07 11/01/2014 0756   CALCIUM 9.8 01/20/2022 0830   PROT 6.2 01/20/2022 0830   ALBUMIN 4.5 01/20/2022 0830   AST 30 01/20/2022 0830   ALT 38 01/20/2022 0830   ALKPHOS 79 01/20/2022 0830   BILITOT 0.7 01/20/2022 0830   GFRNONAA 72 01/15/2021 0842   GFRAA 84 01/15/2021 0842       Component Value Date/Time   WBC 5.3 01/20/2022 0830   WBC 8.4 11/19/2019 2115   RBC 5.19 01/20/2022 0830   RBC 5.28 11/19/2019 2115   HGB 15.7 01/20/2022 0830   HCT 45.7 01/20/2022 0830   PLT 227 01/20/2022 0830   MCV 88 01/20/2022 0830   MCH 30.3 01/20/2022 0830   MCH 30.1 11/19/2019 2115   MCHC 34.4 01/20/2022 0830   MCHC 35.1 11/19/2019 2115   RDW 12.9 01/20/2022 0830   LYMPHSABS 1.7 01/20/2022 0830   EOSABS 0.3 01/20/2022 0830   BASOSABS 0.1 01/20/2022 0830    No results found for: "POCLITH", "LITHIUM"   No results found for: "PHENYTOIN", "PHENOBARB", "VALPROATE", "CBMZ"   .res Assessment: Plan:     Plan:  PDMP reviewed  1. Increase Prozac '20mg'$  to '30mg'$  daily 2. Ativan 0.'5mg'$  daily prn  RTC 4 weeks  Patient advised to contact office with any questions, adverse effects, or acute worsening in signs and symptoms.  Discussed potential benefits, risk, and side effects of benzodiazepines to include potential risk of tolerance and dependence, as well as possible drowsiness. Advised patient not to drive if experiencing drowsiness and to take lowest possible effective dose to minimize risk of dependence and tolerance.  Diagnoses and all orders for this visit:  Generalized anxiety disorder -  FLUoxetine (PROZAC) 20 MG capsule; Take 1 capsule (20 mg total) by mouth daily. -     LORazepam (ATIVAN) 0.5 MG tablet; Take 1 tablet (0.5 mg total) by mouth every 8 (eight) hours.  Obsessive-compulsive disorder, unspecified type -     FLUoxetine (PROZAC) 20 MG capsule; Take 1 capsule (20 mg total) by mouth daily.  Major depressive disorder, recurrent episode, moderate (HCC) -     FLUoxetine (PROZAC) 20 MG capsule; Take 1 capsule (20 mg total) by mouth daily.     Please see After Visit Summary for patient specific instructions.  Future Appointments  Date Time Provider [place]  01/27/2023  9:00 AM Kathyrn Drown, MD [facility] [place]    No orders of the defined types were placed in this encounter.   -------------------------------

## 2022-11-30 ENCOUNTER — Ambulatory Visit: Payer: 59 | Admitting: Adult Health

## 2022-12-09 ENCOUNTER — Telehealth: Payer: Self-pay | Admitting: Adult Health

## 2022-12-09 NOTE — Telephone Encounter (Signed)
Received fax from [employer] re: [employer] for completion of Psychiatric Form. Placed in Traci's box to complete.

## 2022-12-21 NOTE — Telephone Encounter (Signed)
Pt is scheduled tomorrow with Barnett Applebaum 12/22/22 will finish form after visist

## 2022-12-21 NOTE — Telephone Encounter (Signed)
Noted. Ty!

## 2022-12-22 ENCOUNTER — Ambulatory Visit (INDEPENDENT_AMBULATORY_CARE_PROVIDER_SITE_OTHER): Payer: 59 | Admitting: Adult Health

## 2022-12-22 ENCOUNTER — Encounter: Payer: Self-pay | Admitting: Adult Health

## 2022-12-22 DIAGNOSIS — F331 Major depressive disorder, recurrent, moderate: Secondary | ICD-10-CM | POA: Diagnosis not present

## 2022-12-22 DIAGNOSIS — F411 Generalized anxiety disorder: Secondary | ICD-10-CM | POA: Diagnosis not present

## 2022-12-22 DIAGNOSIS — F429 Obsessive-compulsive disorder, unspecified: Secondary | ICD-10-CM

## 2022-12-22 NOTE — Progress Notes (Signed)
John Wu [id number] April 04, 1966 57 y.o.  Subjective:   Patient ID:  John Wu is a 57 y.o. (DOB [birth year]) male.  Chief Complaint: No chief complaint on file.   HPI RC AMISON presents to the office today for follow-up of GAD, MDD and OCD.  Describes mood today as "ok". Pleasant. Mood symptoms - reports decreased depression and anxiety. Denies irritability. Reports decreased worry and rumination. Reports decreased situational stressors. Mood is more consistent. Stating "I'm doing a lot better". Feels like the increase in Prozac in addition to starting NAC has been helpful to manage mood symptoms. Stable interest and motivation. Taking medications as prescribed. Energy levels stable. Active, has a regular exercise routine.  Enjoys some usual interests and activities. Married. Lives with wife - 2 daughters. Spending time with family.  Appetite decreased. Weight stable.  Sleeps well most nights. Averages 7 hours. Focus and concentration stable. Completing tasks. Managing aspects of household. Work going well. 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  Denies SI or HI.  Denies AH or VH.  Previous medications: Lexapro - groggy, Wellbutrin SR '150mg'$    GAD-7    Flowsheet Row Office Visit from 01/22/2021 in [place] Office Visit from 02/18/2020 in [place] Office Visit from 11/01/2019 in [place]  Total GAD-7 Score 14 0 11      PHQ2-9    [place] Office Visit from 06/18/2021 in [place] Visit from 01/22/2021 in [place] Visit from 10/11/2018 in [place] Visit from 09/14/2017 in [place]  PHQ-2 Total Score 0 1 0 0  PHQ-9 Total Score 0 3 -- --      Flowsheet Row ED from 09/06/2022 in [place] ED from 01/04/2021 in [facility] [place]  No Risk No Risk        Review of Systems:  Review of Systems  Musculoskeletal:  Negative for gait problem.  Neurological:  Negative for tremors.  Psychiatric/Behavioral:         Please refer to HPI    Medications: I have reviewed the patient's current medications.  Current Outpatient Medications  Medication Sig Dispense Refill   atorvastatin (LIPITOR) 10 MG tablet Take 1 tablet (10 mg total) by mouth daily. 90 tablet 3   FLUoxetine (PROZAC) 10 MG capsule Take one capsule daily. 90 capsule 3   FLUoxetine (PROZAC) 20 MG capsule Take 1 capsule (20 mg total) by mouth daily. 90 capsule 3   influenza vac split quadrivalent PF (FLUARIX) 0.5 ML injection Inject 0.5 ml into the muscle. 0.5 mL 0   levothyroxine (SYNTHROID) 112 MCG tablet Take 1 tablet (112 mcg total) by mouth daily. 90 tablet 3   LORazepam (ATIVAN) 0.5 MG tablet Take 1 tablet (0.5 mg total) by mouth every 8 (eight) hours. 30 tablet 2   OVER THE COUNTER MEDICATION Fish oil 3 daily Xidra eye drops bid     XIIDRA 5 % SOLN Apply 1 drop to eye 2 (two) times daily.     No current facility-administered medications for this visit.    Medication Side Effects: None  Allergies:  Allergies  Allergen Reactions   Lexapro [Escitalopram Oxalate] Other (See Comments)    grogginess    Past Medical History:  Diagnosis Date   Anxiety    Hyperlipidemia 06/18/2021   Hypothyroidism    IBS (irritable bowel syndrome)  Past Medical History, Surgical history, Social history, and Family history were reviewed and updated as appropriate.   Please see review of systems for further details on the patient's review from today.   Objective:   Physical Exam:  There were no vitals taken for this visit.  Physical Exam Constitutional:      General: He is not in acute distress. Musculoskeletal:        General: No deformity.  Neurological:     Mental Status: He is alert and oriented to person, place, and time.     Coordination:  Coordination normal.  Psychiatric:        Attention and Perception: Attention and perception normal. He does not perceive auditory or visual hallucinations.        Mood and Affect: Mood normal. Mood is not anxious or depressed. Affect is not labile, blunt, angry or inappropriate.        Speech: Speech normal.        Behavior: Behavior normal.        Thought Content: Thought content normal. Thought content is not paranoid or delusional. Thought content does not include homicidal or suicidal ideation. Thought content does not include homicidal or suicidal plan.        Cognition and Memory: Cognition and memory normal.        Judgment: Judgment normal.     Comments: Insight intact     Lab Review:     Component Value Date/Time   NA 140 01/20/2022 0830   K 4.4 01/20/2022 0830   CL 101 01/20/2022 0830   CO2 27 01/20/2022 0830   GLUCOSE 104 (H) 01/20/2022 0830   GLUCOSE 116 (H) 12/18/2019 1658   BUN 16 01/20/2022 0830   CREATININE 1.14 01/20/2022 0830   CREATININE 1.07 11/01/2014 0756   CALCIUM 9.8 01/20/2022 0830   PROT 6.2 01/20/2022 0830   ALBUMIN 4.5 01/20/2022 0830   AST 30 01/20/2022 0830   ALT 38 01/20/2022 0830   ALKPHOS 79 01/20/2022 0830   BILITOT 0.7 01/20/2022 0830   GFRNONAA 72 01/15/2021 0842   GFRAA 84 01/15/2021 0842       Component Value Date/Time   WBC 5.3 01/20/2022 0830   WBC 8.4 11/19/2019 2115   RBC 5.19 01/20/2022 0830   RBC 5.28 11/19/2019 2115   HGB 15.7 01/20/2022 0830   HCT 45.7 01/20/2022 0830   PLT 227 01/20/2022 0830   MCV 88 01/20/2022 0830   MCH 30.3 01/20/2022 0830   MCH 30.1 11/19/2019 2115   MCHC 34.4 01/20/2022 0830   MCHC 35.1 11/19/2019 2115   RDW 12.9 01/20/2022 0830   LYMPHSABS 1.7 01/20/2022 0830   EOSABS 0.3 01/20/2022 0830   BASOSABS 0.1 01/20/2022 0830    No results found for: "POCLITH", "LITHIUM"   No results found for: "PHENYTOIN", "PHENOBARB", "VALPROATE", "CBMZ"   .res Assessment: Plan:    Plan:  PDMP  reviewed  1. Increase Prozac '30mg'$  daily 2. Ativan 0.'5mg'$  daily prn  NAC tabs  RTC 4 weeks  Patient advised to contact office with any questions, adverse effects, or acute worsening in signs and symptoms.  Discussed potential benefits, risk, and side effects of benzodiazepines to include potential risk of tolerance and dependence, as well as possible drowsiness. Advised patient not to drive if experiencing drowsiness and to take lowest possible effective dose to minimize risk of dependence and tolerance.  Diagnoses and all orders for this visit:  Generalized anxiety disorder  Major depressive disorder, recurrent episode, moderate ([place])  Obsessive-compulsive disorder, unspecified type     Please see After Visit Summary for patient specific instructions.  Future Appointments  Date Time Provider [place]  01/27/2023  9:00 AM Kathyrn Drown, MD [facility]  04/27/2023 11:20 AM Zayon Trulson, Berdie Ogren, NP [facility] None    No orders of the defined types were placed in this encounter.   -------------------------------

## 2022-12-26 NOTE — Telephone Encounter (Signed)
Paper work completed.

## 2022-12-29 ENCOUNTER — Other Ambulatory Visit (HOSPITAL_COMMUNITY): Payer: Self-pay

## 2022-12-29 MED ORDER — ZOSTER VAC RECOMB ADJUVANTED 50 MCG/0.5ML IM SUSR
INTRAMUSCULAR | 1 refills | Status: AC
  Filled 2022-12-29: qty 1, 1d supply, fill #0
  Filled 2023-07-13: qty 1, 1d supply, fill #1

## 2023-01-10 ENCOUNTER — Telehealth: Payer: Self-pay | Admitting: Family Medicine

## 2023-01-10 DIAGNOSIS — E785 Hyperlipidemia, unspecified: Secondary | ICD-10-CM

## 2023-01-10 DIAGNOSIS — Z125 Encounter for screening for malignant neoplasm of prostate: Secondary | ICD-10-CM

## 2023-01-10 DIAGNOSIS — Z0000 Encounter for general adult medical examination without abnormal findings: Secondary | ICD-10-CM

## 2023-01-10 DIAGNOSIS — E039 Hypothyroidism, unspecified: Secondary | ICD-10-CM

## 2023-01-10 NOTE — Telephone Encounter (Signed)
Lipid liver TSH metabolic 7 PSA Hypothyroidism hyperlipidemia wellness screening prostate cancer

## 2023-01-10 NOTE — Telephone Encounter (Signed)
Patient is has physical on 2/15 and requesting labs

## 2023-01-10 NOTE — Telephone Encounter (Signed)
Lab orders placed. Left message to return call  

## 2023-01-10 NOTE — Telephone Encounter (Signed)
Last labs completed 01/20/22-PSA; TSH+Free T4; CMP; Hepatic; Lipid and CBC. Please advise. Thank you

## 2023-01-10 NOTE — Telephone Encounter (Signed)
Patient notified

## 2023-01-21 LAB — HEPATIC FUNCTION PANEL
ALT: 36 IU/L (ref 0–44)
AST: 29 IU/L (ref 0–40)
Albumin: 4.4 g/dL (ref 3.8–4.9)
Alkaline Phosphatase: 77 IU/L (ref 44–121)
Bilirubin Total: 0.7 mg/dL (ref 0.0–1.2)
Bilirubin, Direct: 0.2 mg/dL (ref 0.00–0.40)
Total Protein: 6.2 g/dL (ref 6.0–8.5)

## 2023-01-21 LAB — BASIC METABOLIC PANEL
BUN/Creatinine Ratio: 15 (ref 9–20)
BUN: 16 mg/dL (ref 6–24)
CO2: 25 mmol/L (ref 20–29)
Calcium: 9.6 mg/dL (ref 8.7–10.2)
Chloride: 102 mmol/L (ref 96–106)
Creatinine, Ser: 1.06 mg/dL (ref 0.76–1.27)
Glucose: 103 mg/dL — ABNORMAL HIGH (ref 70–99)
Potassium: 4.5 mmol/L (ref 3.5–5.2)
Sodium: 141 mmol/L (ref 134–144)
eGFR: 81 mL/min/{1.73_m2} (ref >59)

## 2023-01-21 LAB — PSA: Prostate Specific Ag, Serum: 1 ng/mL (ref 0.0–4.0)

## 2023-01-21 LAB — LIPID PANEL
Chol/HDL Ratio: 2.9 ratio (ref 0.0–5.0)
Cholesterol, Total: 117 mg/dL (ref 100–199)
HDL: 40 mg/dL (ref >39)
LDL Chol Calc (NIH): 57 mg/dL (ref 0–99)
Triglycerides: 106 mg/dL (ref 0–149)
VLDL Cholesterol Cal: 20 mg/dL (ref 5–40)

## 2023-01-21 LAB — TSH: TSH: 1.76 u[IU]/mL (ref 0.450–4.500)

## 2023-01-27 ENCOUNTER — Ambulatory Visit: Payer: 59 | Admitting: Family Medicine

## 2023-01-27 DIAGNOSIS — Z0001 Encounter for general adult medical examination with abnormal findings: Secondary | ICD-10-CM | POA: Diagnosis not present

## 2023-01-27 DIAGNOSIS — E7800 Pure hypercholesterolemia, unspecified: Secondary | ICD-10-CM

## 2023-01-27 MED ORDER — ATORVASTATIN CALCIUM 10 MG PO TABS: 10.0000 mg | ORAL_TABLET | Freq: Every day | ORAL | 3 refills | Status: DC

## 2023-01-27 MED ORDER — LEVOTHYROXINE SODIUM 112 MCG PO TABS: 112.0000 ug | ORAL_TABLET | Freq: Every day | ORAL | 3 refills | Status: DC
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
## 2023-02-03 ENCOUNTER — Encounter: Payer: Self-pay | Admitting: Family Medicine

## 2023-02-10 ENCOUNTER — Encounter: Payer: Self-pay | Admitting: Radiology

## 2023-03-03 ENCOUNTER — Other Ambulatory Visit: Payer: Self-pay | Admitting: Adult Health

## 2023-03-03 DIAGNOSIS — F411 Generalized anxiety disorder: Secondary | ICD-10-CM

## 2023-03-03 DIAGNOSIS — F429 Obsessive-compulsive disorder, unspecified: Secondary | ICD-10-CM

## 2023-03-03 DIAGNOSIS — F331 Major depressive disorder, recurrent, moderate: Secondary | ICD-10-CM

## 2023-04-27 ENCOUNTER — Encounter: Payer: Self-pay | Admitting: Adult Health

## 2023-04-27 ENCOUNTER — Ambulatory Visit (INDEPENDENT_AMBULATORY_CARE_PROVIDER_SITE_OTHER): Payer: 59 | Admitting: Adult Health

## 2023-04-27 DIAGNOSIS — F429 Obsessive-compulsive disorder, unspecified: Secondary | ICD-10-CM | POA: Diagnosis not present

## 2023-04-27 DIAGNOSIS — F411 Generalized anxiety disorder: Secondary | ICD-10-CM

## 2023-04-27 DIAGNOSIS — F331 Major depressive disorder, recurrent, moderate: Secondary | ICD-10-CM | POA: Diagnosis not present

## 2023-04-27 NOTE — Progress Notes (Signed)
RYKAR WITTENBORN [id number] November 26, 1966 57 y.o.  Subjective:   Patient ID:  John Wu is a 57 y.o. (DOB 1966-01-20) male.  Chief Complaint: No chief complaint on file.   HPI BASHEER RAGUCCI presents to the office today for follow-up of GAD, MDD and OCD.  Describes mood today as "ok". Pleasant. Mood symptoms - denies depression, anxiety, and irritability. Reports decreased worry, rumination, and over thinking. Mood is more consistent. Stating "I'm doing pretty good". Feels like Prozac continues to work well. Also started NAC tabs. Stable interest and motivation. Taking medications as prescribed. Energy levels stable. Active, has a regular exercise routine.  Enjoys some usual interests and activities. Married. Lives with wife - 2 daughters. Spending time with family.  Appetite decreased. Weight stable.  Sleeps well most nights. Averages 7 hours. Focus and concentration stable. Completing tasks. Managing aspects of household. Work going well. 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  Denies SI or HI.  Denies AH or VH. Denies self harm. Denies substance use.  Working with therapist.  Previous medications: Lexapro - groggy, Wellbutrin SR 150mg   GAD-7    Flowsheet Row Office Visit from 01/27/2023 in [facility] Office Visit from 01/22/2021 in [facility] Office Visit from 02/18/2020 in [facility] Office Visit from 11/01/2019 in [facility]  Total GAD-7 Score 0 14 0 11      PHQ2-9    Flowsheet Row Office Visit from 01/27/2023 in [facility] Office Visit from 06/18/2021 in [facility] Office Visit from 01/22/2021 in [facility] Office Visit from 10/11/2018 in [facility] Office Visit from 09/14/2017 in [facility]  PHQ-2 Total Score 0 0 1 0 0  PHQ-9 Total Score 0 0 3 -- --      Flowsheet Row ED  from 09/06/2022 in [facility] ED from 01/04/2021 in [facility] at [place]  C-SSRS RISK CATEGORY No Risk No Risk        Review of Systems:  Review of Systems  Musculoskeletal:  Negative for gait problem.  Neurological:  Negative for tremors.  Psychiatric/Behavioral:         Please refer to HPI    Medications: I have reviewed the patient's current medications.  Current Outpatient Medications  Medication Sig Dispense Refill   FLUoxetine (PROZAC) 10 MG capsule TAKE 1 CAPSULE BY MOUTH DAILY 90 capsule 3   atorvastatin (LIPITOR) 10 MG tablet Take 1 tablet (10 mg total) by mouth daily. 90 tablet 3   FLUoxetine (PROZAC) 20 MG capsule Take 1 capsule (20 mg total) by mouth daily. 90 capsule 3   influenza vac split quadrivalent PF (FLUARIX) 0.5 ML injection Inject 0.5 ml into the muscle. 0.5 mL 0   levothyroxine (SYNTHROID) 112 MCG tablet Take 1 tablet (112 mcg total) by mouth daily. 90 tablet 3   LORazepam (ATIVAN) 0.5 MG tablet Take 1 tablet (0.5 mg total) by mouth every 8 (eight) hours. 30 tablet 2   OVER THE COUNTER MEDICATION Fish oil 3 daily Xidra eye drops bid     XIIDRA 5 % SOLN Apply 1 drop to eye 2 (two) times daily.     Zoster Vaccine Adjuvanted [facility]) injection To be administered by the pharmacist 1 each 1   No current facility-administered medications for this visit.    Medication Side Effects: None  Allergies:  Allergies  Allergen Reactions  Lexapro [Escitalopram Oxalate] Other (See Comments)    grogginess    Past Medical History:  Diagnosis Date   Anxiety    Hyperlipidemia 06/18/2021   Hypothyroidism    IBS (irritable bowel syndrome)     Past Medical History, Surgical history, Social history, and Family history were reviewed and updated as appropriate.   Please see review of systems for further details on the patient's review from today.   Objective:   Physical Exam:  There were no vitals taken  for this visit.  Physical Exam Constitutional:      General: He is not in acute distress. Musculoskeletal:        General: No deformity.  Neurological:     Mental Status: He is alert and oriented to person, place, and time.     Coordination: Coordination normal.  Psychiatric:        Attention and Perception: Attention and perception normal. He does not perceive auditory or visual hallucinations.        Mood and Affect: Mood normal. Mood is not anxious or depressed. Affect is not labile, blunt, angry or inappropriate.        Speech: Speech normal.        Behavior: Behavior normal.        Thought Content: Thought content normal. Thought content is not paranoid or delusional. Thought content does not include homicidal or suicidal ideation. Thought content does not include homicidal or suicidal plan.        Cognition and Memory: Cognition and memory normal.        Judgment: Judgment normal.     Comments: Insight intact    Lab Review:     Component Value Date/Time   NA 141 01/20/2023 0747   K 4.5 01/20/2023 0747   CL 102 01/20/2023 0747   CO2 25 01/20/2023 0747   GLUCOSE 103 (H) 01/20/2023 0747   GLUCOSE 116 (H) 12/18/2019 1658   BUN 16 01/20/2023 0747   CREATININE 1.06 01/20/2023 0747   CREATININE 1.07 11/01/2014 0756   CALCIUM 9.6 01/20/2023 0747   PROT 6.2 01/20/2023 0747   ALBUMIN 4.4 01/20/2023 0747   AST 29 01/20/2023 0747   ALT 36 01/20/2023 0747   ALKPHOS 77 01/20/2023 0747   BILITOT 0.7 01/20/2023 0747   GFRNONAA 72 01/15/2021 0842   GFRAA 84 01/15/2021 0842       Component Value Date/Time   WBC 5.3 01/20/2022 0830   WBC 8.4 11/19/2019 2115   RBC 5.19 01/20/2022 0830   RBC 5.28 11/19/2019 2115   HGB 15.7 01/20/2022 0830   HCT 45.7 01/20/2022 0830   PLT 227 01/20/2022 0830   MCV 88 01/20/2022 0830   MCH 30.3 01/20/2022 0830   MCH 30.1 11/19/2019 2115   MCHC 34.4 01/20/2022 0830   MCHC 35.1 11/19/2019 2115   RDW 12.9 01/20/2022 0830   LYMPHSABS 1.7  01/20/2022 0830   EOSABS 0.3 01/20/2022 0830   BASOSABS 0.1 01/20/2022 0830    No results found for: "POCLITH", "LITHIUM"   No results found for: "PHENYTOIN", "PHENOBARB", "VALPROATE", "CBMZ"   .res Assessment: Plan:    Plan:  PDMP reviewed  1. Prozac 30mg  daily 2. Ativan 0.5mg  daily prn  NAC tabs  RTC 6 months  Patient advised to contact office with any questions, adverse effects, or acute worsening in signs and symptoms.  Discussed potential benefits, risk, and side effects of benzodiazepines to include potential risk of tolerance and dependence, as well as possible drowsiness. Advised patient not  to drive if experiencing drowsiness and to take lowest possible effective dose to minimize risk of dependence and tolerance.  Diagnoses and all orders for this visit:  Generalized anxiety disorder  Major depressive disorder, recurrent episode, moderate (HCC)  Obsessive-compulsive disorder, unspecified type     Please see After Visit Summary for patient specific instructions.  Future Appointments  Date Time Provider Department Center  05/16/2023  3:00 PM Wendall Stade, MD [facility] [place] [facility]    No orders of the defined types were placed in this encounter.   -------------------------------

## 2023-05-08 NOTE — Progress Notes (Deleted)
CARDIOLOGY CONSULT NOTE       Patient ID: John Wu MRN: [id number] DOB/AGE: 12/13/1966 57 y.o.  Admit date: (Not on file) Referring Physician: Eber Hong [facility] Primary Physician: Babs Sciara, MD Primary Cardiologist: Eden Emms  Reason for F/U:  Chest Pain   HPI: 57 y.o. with history of hypothyroidism, IBS, Anxiety and atypical chest pain Seen [facility] ED 11/19/19 r/o normal ECG and CXR. He is on Prozac and sees a therapist. 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, early years/pre at [employer] and has daughter who is becoming one Whole family went to [facility]   Calcium score done  06/13/20 was 17 58 th percentile Started on statin LDL 57 01/20/23   One daughter in [place] looking for a job One in [place] working as a Teacher, early years/pre at [employer]  He was kind enough to bring Me [employer] baseball card from [facility] last visit  ***  ROS All other systems reviewed and negative except as noted above  Past Medical History:  Diagnosis Date   Anxiety    Hyperlipidemia 06/18/2021   Hypothyroidism    IBS (irritable bowel syndrome)     Family History  Problem Relation Age of Onset   Hypertension Father    Heart attack Father    Lung cancer Father    Benign prostatic hyperplasia Brother    Colon cancer Neg Hx    Colon polyps Neg Hx    Esophageal cancer Neg Hx    Stomach cancer Neg Hx    Rectal cancer Neg Hx     Social History   Socioeconomic History   Marital status: Married    Spouse name: Monica   Number of children: 2   Years of education: Not on file   Highest education level: Not on file  Occupational History   Not on file  Tobacco Use   Smoking status: Never   Smokeless tobacco: Never  Vaping Use   Vaping Use: Never used  Substance and Sexual Activity   Alcohol use: No   Drug use: No   Sexual activity: Yes  Other Topics Concern   Not on file  Social  History Narrative   Not on file   Social Determinants of Health   Financial Resource Strain: Not on file  Food Insecurity: Not on file  Transportation Needs: Not on file  Physical Activity: Not on file  Stress: Not on file  Social Connections: Not on file  Intimate Partner Violence: Not on file    Past Surgical History:  Procedure Laterality Date   CHOLECYSTECTOMY  12/13/2005   COLOSTOMY     VASECTOMY  12/13/1998      Physical Exam: There were no vitals taken for this visit.    Affect appropriate Healthy:  appears stated age HEENT: normal Neck supple with no adenopathy JVP normal no bruits no thyromegaly Lungs clear with no wheezing and good diaphragmatic motion Heart:  S1/S2 no murmur, no rub, gallop or click PMI normal Abdomen: benighn, BS positve, no tenderness, no AAA no bruit.  No HSM or HJR Distal pulses intact with no bruits No edema Neuro non-focal Skin warm and dry No muscular weakness   Labs:   Lab Results  Component Value Date   WBC 5.3 01/20/2022   HGB 15.7 01/20/2022   HCT 45.7 01/20/2022   MCV 88 01/20/2022  PLT 227 01/20/2022   No results for input(s): "NA", "K", "CL", "CO2", "BUN", "CREATININE", "CALCIUM", "PROT", "BILITOT", "ALKPHOS", "ALT", "AST", "GLUCOSE" in the last 168 hours.  Invalid input(s): "LABALBU" No results found for: "CKTOTAL", "CKMB", "CKMBINDEX", "TROPONINI"  Lab Results  Component Value Date   CHOL 117 01/20/2023   CHOL 126 01/20/2022   CHOL 127 01/15/2021   Lab Results  Component Value Date   HDL 40 01/20/2023   HDL 41 01/20/2022   HDL 47 01/15/2021   Lab Results  Component Value Date   LDLCALC 57 01/20/2023   LDLCALC 64 01/20/2022   LDLCALC 64 01/15/2021   Lab Results  Component Value Date   TRIG 106 01/20/2023   TRIG 112 01/20/2022   TRIG 78 01/15/2021   Lab Results  Component Value Date   CHOLHDL 2.9 01/20/2023   CHOLHDL 3.1 01/20/2022   CHOLHDL 2.7 01/15/2021   No results found for: "LDLDIRECT"     Radiology: No results found.  EKG: 11/20/19 NSR normal ECG rate 78 bpm   ASSESSMENT AND PLAN:   1. Chest Pain:  Atypical normal labs ECG and CXR  Calcium score 06/14/20 Was 57 58 th percentile for age Started on statin 59/25/22 as LDL was 118 now improved to 57 on Lipitor 10 mg   2. Thyroid:  On replacement TSH normal 01/20/22   3. Anxiety: major issue causing somatic issues On Prozac and Ativan  f/u with Dr Gerda Diss and therapist Seems improved to stable based on last note from [facility] NP 04/27/23    F/U in a year   Signed: Charlton Haws 05/08/2023, 2:47 PM

## 2023-05-16 ENCOUNTER — Ambulatory Visit: Payer: 59 | Admitting: Cardiovascular Disease

## 2023-07-13 ENCOUNTER — Other Ambulatory Visit (HOSPITAL_COMMUNITY): Payer: Self-pay

## 2023-08-17 ENCOUNTER — Telehealth: Payer: Self-pay | Admitting: Family Medicine

## 2023-08-17 NOTE — Telephone Encounter (Signed)
[facility] called to get a change on levothyroxine 112 MCG due to supplier had changed, And they must get approval from provider. [facility] number [phone] reference number [id number]

## 2023-08-17 NOTE — Telephone Encounter (Signed)
They nee ok to switch manufacturers

## 2023-08-18 ENCOUNTER — Telehealth: Payer: Self-pay

## 2023-08-18 NOTE — Telephone Encounter (Signed)
Completed and faxed.

## 2023-08-19 ENCOUNTER — Other Ambulatory Visit (HOSPITAL_COMMUNITY): Payer: Self-pay

## 2023-08-26 NOTE — Telephone Encounter (Signed)
error 

## 2023-09-12 NOTE — Progress Notes (Signed)
CARDIOLOGY CONSULT NOTE       Patient ID: John Wu MRN: [id number] DOB/AGE: Nov 27, 1966 57 y.o.  Admit date: (Not on file) Referring Physician: Eber Hong [facility] Primary Physician: Babs Sciara, MD Primary Cardiologist: Eden Emms  Reason for F/U:  Chest Pain   HPI: 57 y.o. with history of hypothyroidism, IBS, Anxiety and atypical chest pain Seen [facility] ED 11/19/19 r/o normal ECG and CXR. He is on Prozac and sees a therapist. 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   Calcium score done  06/13/20 was 17 58 th percentile Started on statin LDL 64 01/20/22  One daughter in [place] looking for a job One in [place] working as a Teacher, early years/pre at [employer]  He was kind enough to bring Me [employer] baseball card from [facility] prior visit  No cardiac complaints. Weight up a bit. Has season tickets for [employer] baseball Sits behind dugout   ROS All other systems reviewed and negative except as noted above  Past Medical History:  Diagnosis Date   Anxiety    Hyperlipidemia 06/18/2021   Hypothyroidism    IBS (irritable bowel syndrome)     Family History  Problem Relation Age of Onset   Hypertension Father    Heart attack Father    Lung cancer Father    Benign prostatic hyperplasia Brother    Colon cancer Neg Hx    Colon polyps Neg Hx    Esophageal cancer Neg Hx    Stomach cancer Neg Hx    Rectal cancer Neg Hx     Social History   Socioeconomic History   Marital status: Married    Spouse name: Monica   Number of children: 2   Years of education: Not on file   Highest education level: Not on file  Occupational History   Not on file  Tobacco Use   Smoking status: Never   Smokeless tobacco: Never  Vaping Use   Vaping status: Never Used  Substance and Sexual Activity   Alcohol use: No   Drug  use: No   Sexual activity: Yes  Other Topics Concern   Not on file  Social History Narrative   Not on file   Social Determinants of Health   Financial Resource Strain: Not on file  Food Insecurity: Not on file  Transportation Needs: Not on file  Physical Activity: Not on file  Stress: Not on file  Social Connections: Not on file  Intimate Partner Violence: Not on file    Past Surgical History:  Procedure Laterality Date   CHOLECYSTECTOMY  12/13/2005   COLOSTOMY     VASECTOMY  12/13/1998      Physical Exam: There were no vitals taken for this visit.    Affect appropriate Healthy:  appears stated age HEENT: normal Neck supple with no adenopathy JVP normal no bruits no thyromegaly Lungs clear with no wheezing and good diaphragmatic motion Heart:  S1/S2 no murmur, no rub, gallop or click PMI normal Abdomen: benighn, BS positve, no tenderness, no AAA no bruit.  No HSM or HJR Distal pulses intact with no bruits No edema Neuro non-focal Skin warm and dry No muscular weakness   Labs:   Lab Results  Component Value Date   WBC 5.3 01/20/2022  HGB 15.7 01/20/2022   HCT 45.7 01/20/2022   MCV 88 01/20/2022   PLT 227 01/20/2022   No results for input(s): "NA", "K", "CL", "CO2", "BUN", "CREATININE", "CALCIUM", "PROT", "BILITOT", "ALKPHOS", "ALT", "AST", "GLUCOSE" in the last 168 hours.  Invalid input(s): "LABALBU" No results found for: "CKTOTAL", "CKMB", "CKMBINDEX", "TROPONINI"  Lab Results  Component Value Date   CHOL 117 01/20/2023   CHOL 126 01/20/2022   CHOL 127 01/15/2021   Lab Results  Component Value Date   HDL 40 01/20/2023   HDL 41 01/20/2022   HDL 47 01/15/2021   Lab Results  Component Value Date   LDLCALC 57 01/20/2023   LDLCALC 64 01/20/2022   LDLCALC 64 01/15/2021   Lab Results  Component Value Date   TRIG 106 01/20/2023   TRIG 112 01/20/2022   TRIG 78 01/15/2021   Lab Results  Component Value Date   CHOLHDL 2.9 01/20/2023   CHOLHDL  3.1 01/20/2022   CHOLHDL 2.7 01/15/2021   No results found for: "LDLDIRECT"    Radiology: No results found.  EKG: 11/20/19 NSR normal ECG rate 78 bpm   ASSESSMENT AND PLAN:   1. Chest Pain:  Atypical normal labs ECG and CXR  Calcium score 06/14/20 Was 15 58 th percentile for age Started on statin 04/06/21 as LDL was 118 now improved to 57 on Lipitor 10 mg  01/20/23   2. Thyroid:  On replacement TSH normal 01/20/23   3. Anxiety: major issue causing somatic issues On Prozac and Ativan  f/u with Dr Gerda Diss and therapist Seems improved to stable based on last note from [facility] NP   F/U in a year   Signed: Charlton Haws 09/12/2023, 7:59 AM

## 2023-09-16 ENCOUNTER — Encounter: Payer: Self-pay | Admitting: Cardiovascular Disease

## 2023-09-16 ENCOUNTER — Ambulatory Visit: Payer: 59 | Attending: Cardiovascular Disease | Admitting: Cardiovascular Disease

## 2023-09-16 VITALS — BP 130/82 | HR 72 | Ht 70.0 in | Wt 203.6 lb

## 2023-09-16 DIAGNOSIS — R079 Chest pain, unspecified: Secondary | ICD-10-CM

## 2023-09-16 DIAGNOSIS — F411 Generalized anxiety disorder: Secondary | ICD-10-CM | POA: Diagnosis not present

## 2023-09-16 NOTE — Patient Instructions (Signed)
Medication Instructions:  Your physician recommends that you continue on your current medications as directed. Please refer to the Current Medication list given to you today.  *If you need a refill on your cardiac medications before your next appointment, please call your pharmacy*   Lab Work: NONE   If you have labs (blood work) drawn today and your tests are completely normal, you will receive your results only by: MyChart Message (if you have MyChart) OR A paper copy in the mail If you have any lab test that is abnormal or we need to change your treatment, we will call you to review the results.   Testing/Procedures: NONE    Follow-Up: At [facility], you and your health needs are our priority.  As part of our continuing mission to provide you with exceptional heart care, we have created designated Provider Care Teams.  These Care Teams include your primary Cardiologist (physician) and Advanced Practice Providers (APPs -  Physician Assistants and Nurse Practitioners) who all work together to provide you with the care you need, when you need it.  We recommend signing up for the patient portal called "MyChart".  Sign up information is provided on this After Visit Summary.  MyChart is used to connect with patients for Virtual Visits (Telemedicine).  Patients are able to view lab/test results, encounter notes, upcoming appointments, etc.  Non-urgent messages can be sent to your provider as well.   To learn more about what you can do with MyChart, go to [email or web address].    Your next appointment:   1 year(s)  Provider:   You may see Peter Nishan, MD or one of the following Advanced Practice Providers on your designated Care Team:   Brittany Strader, PA-C  Michele Lenze, PA-C     Other Instructions Thank you for choosing [facility]!    

## 2023-10-10 ENCOUNTER — Other Ambulatory Visit (HOSPITAL_COMMUNITY): Payer: Self-pay

## 2023-10-10 MED ORDER — INFLUENZA VIRUS VACC SPLIT PF (FLUZONE) 0.5 ML IM SUSY
0.5000 mL | PREFILLED_SYRINGE | Freq: Once | INTRAMUSCULAR | 0 refills | Status: AC
  Filled 2023-10-10: qty 0.5, 1d supply, fill #0

## 2023-10-20 ENCOUNTER — Other Ambulatory Visit (HOSPITAL_COMMUNITY): Payer: Self-pay

## 2023-10-26 ENCOUNTER — Encounter: Payer: Self-pay | Admitting: Adult Health

## 2023-10-26 ENCOUNTER — Ambulatory Visit: Payer: 59 | Admitting: Adult Health

## 2023-10-26 DIAGNOSIS — F411 Generalized anxiety disorder: Secondary | ICD-10-CM

## 2023-10-26 DIAGNOSIS — F429 Obsessive-compulsive disorder, unspecified: Secondary | ICD-10-CM

## 2023-10-26 MED ORDER — FLUOXETINE HCL 20 MG PO CAPS: 20.0000 mg | ORAL_CAPSULE | Freq: Every day | ORAL | 3 refills | Status: DC

## 2023-10-26 NOTE — Progress Notes (Signed)
John Wu [id number] 12-27-1965 57 y.o.  Subjective:   Patient ID:  John Wu is a 57 y.o. (DOB Aug 15, 1966) male.  Chief Complaint: No chief complaint on file.   HPI John Wu presents to the office today for follow-up of GAD, MDD and OCD.  Describes mood today as "ok". Pleasant. Mood symptoms - denies depression, anxiety, and irritability. Denies panic attacks. Denies worry, rumination, and over thinking. Mood is stable. Stating "I feel like I'm doing good". Feels like Prozac continues to work well. Stable interest and motivation. Taking medications as prescribed. Energy levels stable. Active, has a regular exercise routine.  Enjoys some usual interests and activities. Married. Lives with wife - 2 daughters. Spending time with family.  Appetite decreased. Weight stable.  Sleeps well most nights. Averages 7 hours. Focus and concentration stable. Completing tasks. Managing aspects of household. Work going well. 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  Denies SI or HI.  Denies AH or VH. Denies self harm. Denies substance use.  Working with therapist.  Previous medications: Lexapro - groggy, Wellbutrin SR 150mg    GAD-7    Flowsheet Row Office Visit from 01/27/2023 in [facility] Office Visit from 01/22/2021 in [facility] Office Visit from 02/18/2020 in [facility] Office Visit from 11/01/2019 in [facility]  Total GAD-7 Score 0 14 0 11      PHQ2-9    Flowsheet Row Office Visit from 01/27/2023 in [facility] Office Visit from 06/18/2021 in [facility] Office Visit from 01/22/2021 in [facility] Office Visit from 10/11/2018 in [facility] Office Visit from 09/14/2017 in [facility]  PHQ-2 Total Score 0 0 1 0 0  PHQ-9 Total Score 0 0 3 -- --      Flowsheet Row ED from 09/06/2022  in [facility] Emergency Department at [facility] ED from 01/04/2021 in [facility] [place]  C-SSRS RISK CATEGORY No Risk No Risk        Review of Systems:  Review of Systems  Musculoskeletal:  Negative for gait problem.  Neurological:  Negative for tremors.  Psychiatric/Behavioral:         Please refer to HPI    Medications: I have reviewed the patient's current medications.  Current Outpatient Medications  Medication Sig Dispense Refill   atorvastatin (LIPITOR) 10 MG tablet Take 1 tablet (10 mg total) by mouth daily. 90 tablet 3   FLUoxetine (PROZAC) 10 MG capsule TAKE 1 CAPSULE BY MOUTH DAILY 90 capsule 3   FLUoxetine (PROZAC) 20 MG capsule Take 1 capsule (20 mg total) by mouth daily. 90 capsule 3   influenza vac split quadrivalent PF (FLUARIX) 0.5 ML injection Inject 0.5 ml into the muscle. 0.5 mL 0   levothyroxine (SYNTHROID) 112 MCG tablet Take 1 tablet (112 mcg total) by mouth daily. 90 tablet 3   LORazepam (ATIVAN) 0.5 MG tablet Take 1 tablet (0.5 mg total) by mouth every 8 (eight) hours. 30 tablet 2   OVER THE COUNTER MEDICATION Fish oil 3 daily Xidra eye drops bid     XIIDRA 5 % SOLN Apply 1 drop to eye 2 (two) times daily.     Zoster Vaccine Adjuvanted [facility]) injection To be administered by the pharmacist 1 each 1   No current facility-administered medications for this visit.    Medication Side Effects: None  Allergies:  Allergies  Allergen Reactions  Lexapro [Escitalopram Oxalate] Other (See Comments)    grogginess    Past Medical History:  Diagnosis Date   Anxiety    Hyperlipidemia 06/18/2021   Hypothyroidism    IBS (irritable bowel syndrome)     Past Medical History, Surgical history, Social history, and Family history were reviewed and updated as appropriate.   Please see review of systems for further details on the patient's review from today.   Objective:   Physical Exam:  There were no vitals taken for this  visit.  Physical Exam Constitutional:      General: He is not in acute distress. Musculoskeletal:        General: No deformity.  Neurological:     Mental Status: He is alert and oriented to person, place, and time.     Coordination: Coordination normal.  Psychiatric:        Attention and Perception: Attention and perception normal. He does not perceive auditory or visual hallucinations.        Mood and Affect: Mood normal. Mood is not anxious or depressed. Affect is not labile, blunt, angry or inappropriate.        Speech: Speech normal.        Behavior: Behavior normal.        Thought Content: Thought content normal. Thought content is not paranoid or delusional. Thought content does not include homicidal or suicidal ideation. Thought content does not include homicidal or suicidal plan.        Cognition and Memory: Cognition and memory normal.        Judgment: Judgment normal.     Comments: Insight intact     Lab Review:     Component Value Date/Time   NA 141 01/20/2023 0747   K 4.5 01/20/2023 0747   CL 102 01/20/2023 0747   CO2 25 01/20/2023 0747   GLUCOSE 103 (H) 01/20/2023 0747   GLUCOSE 116 (H) 12/18/2019 1658   BUN 16 01/20/2023 0747   CREATININE 1.06 01/20/2023 0747   CREATININE 1.07 11/01/2014 0756   CALCIUM 9.6 01/20/2023 0747   PROT 6.2 01/20/2023 0747   ALBUMIN 4.4 01/20/2023 0747   AST 29 01/20/2023 0747   ALT 36 01/20/2023 0747   ALKPHOS 77 01/20/2023 0747   BILITOT 0.7 01/20/2023 0747   GFRNONAA 72 01/15/2021 0842   GFRAA 84 01/15/2021 0842       Component Value Date/Time   WBC 5.3 01/20/2022 0830   WBC 8.4 11/19/2019 2115   RBC 5.19 01/20/2022 0830   RBC 5.28 11/19/2019 2115   HGB 15.7 01/20/2022 0830   HCT 45.7 01/20/2022 0830   PLT 227 01/20/2022 0830   MCV 88 01/20/2022 0830   MCH 30.3 01/20/2022 0830   MCH 30.1 11/19/2019 2115   MCHC 34.4 01/20/2022 0830   MCHC 35.1 11/19/2019 2115   RDW 12.9 01/20/2022 0830   LYMPHSABS 1.7 01/20/2022  0830   EOSABS 0.3 01/20/2022 0830   BASOSABS 0.1 01/20/2022 0830    No results found for: "POCLITH", "LITHIUM"   No results found for: "PHENYTOIN", "PHENOBARB", "VALPROATE", "CBMZ"   .res Assessment: Plan:    Plan:  PDMP reviewed  1. Prozac 30mg  daily  D/C Ativan 0.5mg  daily prn  NAC tabs  RTC 6 months  Patient advised to contact office with any questions, adverse effects, or acute worsening in signs and symptoms.  Discussed potential benefits, risk, and side effects of benzodiazepines to include potential risk of tolerance and dependence, as well as possible drowsiness. Advised  patient not to drive if experiencing drowsiness and to take lowest possible effective dose to minimize risk of dependence and tolerance.  Diagnoses and all orders for this visit:  Major depressive disorder, recurrent episode, moderate (HCC) -     FLUoxetine (PROZAC) 20 MG capsule; Take 1 capsule (20 mg total) by mouth daily.  Obsessive-compulsive disorder, unspecified type -     FLUoxetine (PROZAC) 20 MG capsule; Take 1 capsule (20 mg total) by mouth daily.  Generalized anxiety disorder -     FLUoxetine (PROZAC) 20 MG capsule; Take 1 capsule (20 mg total) by mouth daily.     Please see After Visit Summary for patient specific instructions.  No future appointments.   No orders of the defined types were placed in this encounter.   -------------------------------

## 2023-11-04 ENCOUNTER — Other Ambulatory Visit (HOSPITAL_COMMUNITY): Payer: Self-pay

## 2023-12-25 ENCOUNTER — Other Ambulatory Visit: Payer: Self-pay | Admitting: Adult Health

## 2023-12-25 DIAGNOSIS — F331 Major depressive disorder, recurrent, moderate: Secondary | ICD-10-CM

## 2023-12-25 DIAGNOSIS — F411 Generalized anxiety disorder: Secondary | ICD-10-CM

## 2023-12-25 DIAGNOSIS — F429 Obsessive-compulsive disorder, unspecified: Secondary | ICD-10-CM

## 2023-12-28 ENCOUNTER — Telehealth: Payer: Self-pay | Admitting: *Deleted

## 2023-12-28 NOTE — Telephone Encounter (Signed)
Copied from CRM [id number]. Topic: Clinical - Request for Lab/Test Order >> Dec 28, 2023  2:39 PM Dennison Nancy wrote: Reason for CRM: patient requesting order blood work labs would like done a week before his physical appointment which is on 02/20/24  Please call patient when order is ready [phone]

## 2023-12-29 NOTE — Telephone Encounter (Signed)
Lipid, liver, metabolic 7, TSH, free T4, PSA Hyperlipidemia, high risk medication, hypothyroidism, screening prostate cancer

## 2023-12-30 ENCOUNTER — Other Ambulatory Visit: Payer: Self-pay

## 2023-12-30 DIAGNOSIS — E785 Hyperlipidemia, unspecified: Secondary | ICD-10-CM

## 2023-12-30 DIAGNOSIS — E039 Hypothyroidism, unspecified: Secondary | ICD-10-CM

## 2023-12-30 DIAGNOSIS — Z125 Encounter for screening for malignant neoplasm of prostate: Secondary | ICD-10-CM

## 2023-12-30 DIAGNOSIS — Z79899 Other long term (current) drug therapy: Secondary | ICD-10-CM

## 2024-01-04 ENCOUNTER — Other Ambulatory Visit: Payer: Self-pay | Admitting: Family Medicine

## 2024-01-04 DIAGNOSIS — E7800 Pure hypercholesterolemia, unspecified: Secondary | ICD-10-CM

## 2024-02-13 ENCOUNTER — Other Ambulatory Visit: Payer: Self-pay | Admitting: Family Medicine

## 2024-02-13 DIAGNOSIS — E7800 Pure hypercholesterolemia, unspecified: Secondary | ICD-10-CM

## 2024-02-14 ENCOUNTER — Encounter: Payer: Self-pay | Admitting: Family Medicine

## 2024-02-14 LAB — PSA: Prostate Specific Ag, Serum: 1.2 ng/mL (ref 0.0–4.0)

## 2024-02-14 LAB — BASIC METABOLIC PANEL
BUN/Creatinine Ratio: 14 (ref 9–20)
BUN: 15 mg/dL (ref 6–24)
CO2: 28 mmol/L (ref 20–29)
Calcium: 9.4 mg/dL (ref 8.7–10.2)
Chloride: 104 mmol/L (ref 96–106)
Creatinine, Ser: 1.04 mg/dL (ref 0.76–1.27)
Glucose: 113 mg/dL — ABNORMAL HIGH (ref 70–99)
Potassium: 4.1 mmol/L (ref 3.5–5.2)
Sodium: 142 mmol/L (ref 134–144)
eGFR: 83 mL/min/{1.73_m2} (ref >59)

## 2024-02-14 LAB — HEPATIC FUNCTION PANEL
ALT: 31 IU/L (ref 0–44)
AST: 23 IU/L (ref 0–40)
Albumin: 4.3 g/dL (ref 3.8–4.9)
Alkaline Phosphatase: 85 IU/L (ref 44–121)
Bilirubin Total: 0.5 mg/dL (ref 0.0–1.2)
Bilirubin, Direct: 0.17 mg/dL (ref 0.00–0.40)
Total Protein: 5.9 g/dL — ABNORMAL LOW (ref 6.0–8.5)

## 2024-02-14 LAB — LIPID PANEL
Chol/HDL Ratio: 3.2 ratio (ref 0.0–5.0)
Cholesterol, Total: 121 mg/dL (ref 100–199)
HDL: 38 mg/dL — ABNORMAL LOW (ref >39)
LDL Chol Calc (NIH): 61 mg/dL (ref 0–99)
Triglycerides: 121 mg/dL (ref 0–149)
VLDL Cholesterol Cal: 22 mg/dL (ref 5–40)

## 2024-02-14 LAB — TSH+FREE T4
Free T4: 1.03 ng/dL (ref 0.82–1.77)
TSH: 1.9 u[IU]/mL (ref 0.450–4.500)

## 2024-02-20 ENCOUNTER — Ambulatory Visit: Payer: 59 | Admitting: Family Medicine

## 2024-02-20 ENCOUNTER — Encounter: Payer: Self-pay | Admitting: Family Medicine

## 2024-02-20 VITALS — BP 117/81 | HR 97 | Temp 98.4°F | Ht 70.0 in | Wt 205.0 lb

## 2024-02-20 DIAGNOSIS — Z0000 Encounter for general adult medical examination without abnormal findings: Secondary | ICD-10-CM

## 2024-02-20 DIAGNOSIS — R7989 Other specified abnormal findings of blood chemistry: Secondary | ICD-10-CM

## 2024-02-20 DIAGNOSIS — Z0001 Encounter for general adult medical examination with abnormal findings: Secondary | ICD-10-CM

## 2024-02-20 DIAGNOSIS — E7800 Pure hypercholesterolemia, unspecified: Secondary | ICD-10-CM

## 2024-02-20 DIAGNOSIS — R7301 Impaired fasting glucose: Secondary | ICD-10-CM

## 2024-02-20 DIAGNOSIS — E039 Hypothyroidism, unspecified: Secondary | ICD-10-CM

## 2024-02-20 MED ORDER — ATORVASTATIN CALCIUM 10 MG PO TABS: 10.0000 mg | ORAL_TABLET | Freq: Every day | ORAL | 3 refills | Status: DC

## 2024-02-20 MED ORDER — LEVOTHYROXINE SODIUM 112 MCG PO TABS: 112.0000 ug | ORAL_TABLET | Freq: Every day | ORAL | 3 refills | Status: AC

## 2024-02-20 NOTE — Progress Notes (Signed)
   Subjective:    Patient ID: John Wu, male    DOB: [birth year], 58 y.o.   MRN: [id number]  HPI The patient comes in today for a wellness visit.    A review of their health history was completed.  A review of medications was also completed.  Any needed refills; does need refills  Eating habits: Overall healthy eating habits, does drink half sweet tea tries to minimize breads  Falls/  MVA accidents in past few months: No accidents or injuries  Regular exercise: Fit send regular activity  Specialist pt sees on regular basis: Has seen cardiologist, mental health  Preventative health issues were discussed.   Additional concerns: Reviewed labs  Patient has seen cardiologist recently and they gave him a good bill of health He also sees his mental health specialist on medication does his counseling on a regular basis doing well with that   Review of Systems     Objective:   Physical Exam  General-in no acute distress Eyes-no discharge Lungs-respiratory rate normal, CTA CV-no murmurs,RRR Extremities skin warm dry no edema Neuro grossly normal Behavior normal, alert Prostate exam normal size      Assessment & Plan:   1. Well adult exam (Primary) Adult wellness-complete.wellness physical was conducted today. Importance of diet and exercise were discussed in detail.  Importance of stress reduction and healthy living were discussed.  In addition to this a discussion regarding safety was also covered.  We also reviewed over immunizations and gave recommendations regarding current immunization needed for age.   In addition to this additional areas were also touched on including: Preventative health exams needed:  Colonoscopy 2027  Patient was advised yearly wellness exam   2. Elevated LDL cholesterol level Goal is to keep LDL below 70 Patient does have history of elevated coronary calcium but it was not in a dangerous range - atorvastatin (LIPITOR) 10 MG  tablet; Take 1 tablet (10 mg total) by mouth daily.  Dispense: 90 tablet; Refill: 3  3. Hypothyroidism, unspecified type Recent lab work looks good  Protein level slightly low patient make a concentrated effort with healthy eating we will relook at this again in 6 months time  Fasting hyperglycemia we will look at A1c in 6 months time

## 2024-02-22 ENCOUNTER — Other Ambulatory Visit: Payer: Self-pay

## 2024-02-22 DIAGNOSIS — R7989 Other specified abnormal findings of blood chemistry: Secondary | ICD-10-CM

## 2024-02-22 DIAGNOSIS — R7301 Impaired fasting glucose: Secondary | ICD-10-CM

## 2024-04-30 ENCOUNTER — Encounter: Payer: Self-pay | Admitting: Adult Health

## 2024-04-30 ENCOUNTER — Telehealth: Payer: 59 | Admitting: Adult Health

## 2024-04-30 DIAGNOSIS — F331 Major depressive disorder, recurrent, moderate: Secondary | ICD-10-CM | POA: Diagnosis not present

## 2024-04-30 DIAGNOSIS — F411 Generalized anxiety disorder: Secondary | ICD-10-CM

## 2024-04-30 DIAGNOSIS — F429 Obsessive-compulsive disorder, unspecified: Secondary | ICD-10-CM | POA: Diagnosis not present

## 2024-04-30 MED ORDER — FLUOXETINE HCL 20 MG PO CAPS: 20.0000 mg | ORAL_CAPSULE | Freq: Every day | ORAL | 3 refills | Status: AC

## 2024-04-30 MED ORDER — FLUOXETINE HCL 10 MG PO CAPS: ORAL_CAPSULE | ORAL | 3 refills | Status: AC

## 2024-04-30 NOTE — Progress Notes (Signed)
 John Wu [id number] September 08, 1966 58 y.o.  Virtual Visit via Video Note  I connected with pt @ on 04/30/24 at  2:30 PM EDT by a video enabled telemedicine application and verified that I am speaking with the correct person using two identifiers.   I discussed the limitations of evaluation and management by telemedicine and the availability of in person appointments. The patient expressed understanding and agreed to proceed.  I discussed the assessment and treatment plan with the patient. The patient was provided an opportunity to ask questions and all were answered. The patient agreed with the plan and demonstrated an understanding of the instructions.   The patient was advised to call back or seek an in-person evaluation if the symptoms worsen or if the condition fails to improve as anticipated.  I provided 20 minutes of non-face-to-face time during this encounter.  The patient was located at home.  The provider was located at [facility].   John Camera, NP   Subjective:   Patient ID:  John Wu is a 58 y.o. (DOB 02/07/1966) male.  Chief Complaint: No chief complaint on file.   HPI SHIA EBER presents for follow-up of GAD, MDD and OCD.  Describes mood today as "ok". Pleasant. Mood symptoms - denies depression, anxiety and irritability. Reports stable interest and motivation. Denies panic attacks. Denies worry, rumination and over thinking. Reports mood is stable. Stating "I feel like I'm doing pretty good". Feels like Prozac  continues to work well. Taking medications as prescribed. Energy levels stable. Active, has a regular exercise routine.  Enjoys some usual interests and activities. Married. Lives with wife - 2 daughters. Spending time with family.  Appetite decreased. Weight stable.  Sleeps well most nights. Averages 7 hours. Focus and concentration stable. Completing tasks. Managing aspects of household. Work going well. 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  Denies SI or HI.  Denies AH or VH. Denies self harm. Denies substance use.  Working with therapist.  Previous medications: Lexapro  - groggy, Wellbutrin  SR 150mg     Review of Systems:  Review of Systems  Musculoskeletal:  Negative for gait problem.  Neurological:  Negative for tremors.  Psychiatric/Behavioral:         Please refer to HPI    Medications: I have reviewed the patient's current medications.  Current Outpatient Medications  Medication Sig Dispense Refill   atorvastatin  (LIPITOR) 10 MG tablet Take 1 tablet (10 mg total) by mouth daily. 90 tablet 3   FLUoxetine  (PROZAC ) 10 MG capsule TAKE 1 CAPSULE BY MOUTH DAILY  WITH 20MG  CAPSULE FOR A TOTAL  DAILY DOSE OF 30MG  90 capsule 1   FLUoxetine  (PROZAC ) 20 MG capsule Take 1 capsule (20 mg total) by mouth daily. 90 capsule 3   influenza vac split quadrivalent PF (FLUARIX) 0.5 ML injection Inject 0.5 ml into the muscle. 0.5 mL 0   levothyroxine  (SYNTHROID ) 112 MCG tablet Take 1 tablet (112 mcg total) by mouth daily. 90 tablet 3   OVER THE COUNTER MEDICATION Fish oil 3 daily Xidra eye drops bid     XIIDRA 5 % SOLN Apply 1 drop to eye 2 (two) times daily.     Zoster Vaccine Adjuvanted (SHINGRIX ) injection To be administered by the pharmacist 1 each 1   No current facility-administered medications for this visit.    Medication Side Effects: None  Allergies:  Allergies  Allergen Reactions   Lexapro  [Escitalopram  Oxalate] Other (See Comments)  grogginess    Past Medical History:  Diagnosis Date   Anxiety    Hyperlipidemia 06/18/2021   Hypothyroidism    IBS (irritable bowel syndrome)     Family History  Problem Relation Age of Onset   Hypertension Father    Heart attack Father    Lung cancer Father    Diabetes Brother    Benign prostatic hyperplasia Brother    Colon cancer Neg Hx    Colon polyps Neg Hx    Esophageal cancer Neg Hx    Stomach cancer Neg Hx     Rectal cancer Neg Hx     Social History   Socioeconomic History   Marital status: Married    Spouse name: Monica   Number of children: 2   Years of education: Not on file   Highest education level: Not on file  Occupational History   Not on file  Tobacco Use   Smoking status: Never   Smokeless tobacco: Never  Vaping Use   Vaping status: Never Used  Substance and Sexual Activity   Alcohol use: No   Drug use: No   Sexual activity: Yes  Other Topics Concern   Not on file  Social History Narrative   Not on file   Social Drivers of Health   Financial Resource Strain: Not on file  Food Insecurity: Not on file  Transportation Needs: Not on file  Physical Activity: Not on file  Stress: Not on file  Social Connections: Not on file  Intimate Partner Violence: Not on file    Past Medical History, Surgical history, Social history, and Family history were reviewed and updated as appropriate.   Please see review of systems for further details on the patient's review from today.   Objective:   Physical Exam:  There were no vitals taken for this visit.  Physical Exam Constitutional:      General: He is not in acute distress. Musculoskeletal:        General: No deformity.  Neurological:     Mental Status: He is alert and oriented to person, place, and time.     Coordination: Coordination normal.  Psychiatric:        Attention and Perception: Attention and perception normal. He does not perceive auditory or visual hallucinations.        Mood and Affect: Mood normal. Mood is not anxious or depressed. Affect is not labile, blunt, angry or inappropriate.        Speech: Speech normal.        Behavior: Behavior normal.        Thought Content: Thought content normal. Thought content is not paranoid or delusional. Thought content does not include homicidal or suicidal ideation. Thought content does not include homicidal or suicidal plan.        Cognition and Memory: Cognition and  memory normal.        Judgment: Judgment normal.     Comments: Insight intact     Lab Review:     Component Value Date/Time   NA 142 02/13/2024 0757   K 4.1 02/13/2024 0757   CL 104 02/13/2024 0757   CO2 28 02/13/2024 0757   GLUCOSE 113 (H) 02/13/2024 0757   GLUCOSE 116 (H) 12/18/2019 1658   BUN 15 02/13/2024 0757   CREATININE 1.04 02/13/2024 0757   CREATININE 1.07 11/01/2014 0756   CALCIUM  9.4 02/13/2024 0757   PROT 5.9 (L) 02/13/2024 0757   ALBUMIN 4.3 02/13/2024 0757   AST 23 02/13/2024  0757   ALT 31 02/13/2024 0757   ALKPHOS 85 02/13/2024 0757   BILITOT 0.5 02/13/2024 0757   GFRNONAA 72 01/15/2021 0842   GFRAA 84 01/15/2021 0842       Component Value Date/Time   WBC 5.3 01/20/2022 0830   WBC 8.4 11/19/2019 2115   RBC 5.19 01/20/2022 0830   RBC 5.28 11/19/2019 2115   HGB 15.7 01/20/2022 0830   HCT 45.7 01/20/2022 0830   PLT 227 01/20/2022 0830   MCV 88 01/20/2022 0830   MCH 30.3 01/20/2022 0830   MCH 30.1 11/19/2019 2115   MCHC 34.4 01/20/2022 0830   MCHC 35.1 11/19/2019 2115   RDW 12.9 01/20/2022 0830   LYMPHSABS 1.7 01/20/2022 0830   EOSABS 0.3 01/20/2022 0830   BASOSABS 0.1 01/20/2022 0830    No results found for: "POCLITH", "LITHIUM"   No results found for: "PHENYTOIN", "PHENOBARB", "VALPROATE", "CBMZ"   .res Assessment: Plan:    Plan:  PDMP reviewed  1. Prozac  30mg  daily  NAC tabs  RTC 6 months  Patient advised to contact office with any questions, adverse effects, or acute worsening in signs and symptoms.  20 minutes spent dedicated to the care of this patient on the date of this encounter to include pre-visit review of records, ordering of medication, post visit documentation, and face-to-face time with the patient discussing GAD, MDD and OCD. Discussed continuing current medication regimen.  Discussed potential benefits, risk, and side effects of benzodiazepines to include potential risk of tolerance and dependence, as well as possible  drowsiness. Advised patient not to drive if experiencing drowsiness and to take lowest possible effective dose to minimize risk of dependence and tolerance.  There are no diagnoses linked to this encounter.   Please see After Visit Summary for patient specific instructions.  Future Appointments  Date Time Provider Department Center  04/30/2024  2:30 PM Antiono Ettinger Nattalie, NP [facility] None    No orders of the defined types were placed in this encounter.     -------------------------------

## 2024-07-10 LAB — HEPATIC FUNCTION PANEL
ALT: 28 IU/L (ref 0–44)
AST: 24 IU/L (ref 0–40)
Albumin: 4.3 g/dL (ref 3.8–4.9)
Alkaline Phosphatase: 81 IU/L (ref 44–121)
Bilirubin Total: 0.8 mg/dL (ref 0.0–1.2)
Bilirubin, Direct: 0.24 mg/dL (ref 0.00–0.40)
Total Protein: 6 g/dL (ref 6.0–8.5)

## 2024-07-10 LAB — HEMOGLOBIN A1C
Est. average glucose Bld gHb Est-mCnc: 97 mg/dL
Hgb A1c MFr Bld: 5 % (ref 4.8–5.6)

## 2024-07-11 ENCOUNTER — Ambulatory Visit: Payer: Self-pay | Admitting: Family Medicine

## 2024-09-24 ENCOUNTER — Ambulatory Visit: Admitting: Cardiovascular Disease

## 2024-09-28 ENCOUNTER — Ambulatory Visit: Admitting: Cardiovascular Disease

## 2024-10-04 ENCOUNTER — Other Ambulatory Visit (HOSPITAL_COMMUNITY): Payer: Self-pay

## 2024-10-04 MED ORDER — FLUZONE 0.5 ML IM SUSY
0.5000 mL | PREFILLED_SYRINGE | Freq: Once | INTRAMUSCULAR | 0 refills | Status: AC
  Filled 2024-10-04: qty 0.5, 1d supply, fill #0

## 2024-10-25 ENCOUNTER — Telehealth: Admitting: Adult Health

## 2024-10-25 ENCOUNTER — Encounter: Payer: Self-pay | Admitting: Adult Health

## 2024-10-25 DIAGNOSIS — F331 Major depressive disorder, recurrent, moderate: Secondary | ICD-10-CM

## 2024-10-25 DIAGNOSIS — F411 Generalized anxiety disorder: Secondary | ICD-10-CM

## 2024-10-25 DIAGNOSIS — F429 Obsessive-compulsive disorder, unspecified: Secondary | ICD-10-CM

## 2024-10-25 NOTE — Progress Notes (Signed)
 John Wu [id number] 1966-10-11 58 y.o.  Virtual Visit via Video Note  I connected with pt @ on 10/25/24 at  2:30 PM EST by a video enabled telemedicine application and verified that I am speaking with the correct person using two identifiers.   I discussed the limitations of evaluation and management by telemedicine and the availability of in person appointments. The patient expressed understanding and agreed to proceed.  I discussed the assessment and treatment plan with the patient. The patient was provided an opportunity to ask questions and all were answered. The patient agreed with the plan and demonstrated an understanding of the instructions.   The patient was advised to call back or seek an in-person evaluation if the symptoms worsen or if the condition fails to improve as anticipated.  I provided 15 minutes of non-face-to-face time during this encounter.  The patient was located at home.  The provider was located at [facility].   Angeline LOISE Sayers, NP   Subjective:   Patient ID:  John Wu is a 58 y.o. (DOB Feb 25, 1966) male.  Chief Complaint: No chief complaint on file.   HPI John Wu presents for follow-up of GAD, MDD and OCD.  Describes mood today as ok. Pleasant. Mood symptoms - denies depression, anxiety and irritability. Reports stable interest and motivation. Denies panic attacks. Denies worry, rumination and over thinking. Reports mood is stable. Stating I feel like I'm doing good. Feels like Prozac  30mg  continues to work well - may reduce to 20mg  daily between visits. Taking medications as prescribed. Energy levels stable. Active, has a regular exercise routine.  Enjoys some usual interests and activities. Married. Lives with wife - 2 daughters. Spending time with family.  Appetite decreased. Weight loss - intentional.  Sleeps well most nights. Averages 7 hours. Focus and concentration stable. Completing tasks. Managing aspects  of household. Work going well. 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  Denies SI or HI.  Denies AH or VH. Denies self harm. Denies substance use.  Working with therapist.  Previous medications: Lexapro  - groggy, Wellbutrin  SR 150mg    Review of Systems:  Review of Systems  Musculoskeletal:  Negative for gait problem.  Neurological:  Negative for tremors.  Psychiatric/Behavioral:         Please refer to HPI    Medications: I have reviewed the patient's current medications.  Current Outpatient Medications  Medication Sig Dispense Refill   atorvastatin  (LIPITOR) 10 MG tablet Take 1 tablet (10 mg total) by mouth daily. 90 tablet 3   FLUoxetine  (PROZAC ) 10 MG capsule TAKE 1 CAPSULE BY MOUTH DAILY  WITH 20MG  CAPSULE FOR A TOTAL  DAILY DOSE OF 30MG  90 capsule 3   FLUoxetine  (PROZAC ) 20 MG capsule Take 1 capsule (20 mg total) by mouth daily. 90 capsule 3   influenza vac split quadrivalent PF (FLUARIX) 0.5 ML injection Inject 0.5 ml into the muscle. 0.5 mL 0   levothyroxine  (SYNTHROID ) 112 MCG tablet Take 1 tablet (112 mcg total) by mouth daily. 90 tablet 3   OVER THE COUNTER MEDICATION Fish oil 3 daily Xidra eye drops bid     XIIDRA 5 % SOLN Apply 1 drop to eye 2 (two) times daily.     Zoster Vaccine Adjuvanted (SHINGRIX ) injection To be administered by the pharmacist 1 each 1   No current facility-administered medications for this visit.    Medication Side Effects: None  Allergies:  Allergies  Allergen Reactions  Lexapro  [Escitalopram  Oxalate] Other (See Comments)    grogginess    Past Medical History:  Diagnosis Date   Anxiety    Hyperlipidemia 06/18/2021   Hypothyroidism    IBS (irritable bowel syndrome)     Family History  Problem Relation Age of Onset   Hypertension Father    Heart attack Father    Lung cancer Father    Diabetes Brother    Benign prostatic hyperplasia Brother    Colon cancer Neg Hx    Colon polyps Neg Hx     Esophageal cancer Neg Hx    Stomach cancer Neg Hx    Rectal cancer Neg Hx     Social History   Socioeconomic History   Marital status: Married    Spouse name: Monica   Number of children: 2   Years of education: Not on file   Highest education level: Not on file  Occupational History   Not on file  Tobacco Use   Smoking status: Never   Smokeless tobacco: Never  Vaping Use   Vaping status: Never Used  Substance and Sexual Activity   Alcohol use: No   Drug use: No   Sexual activity: Yes  Other Topics Concern   Not on file  Social History Narrative   Not on file   Social Drivers of Health   Financial Resource Strain: Not on file  Food Insecurity: Not on file  Transportation Needs: Not on file  Physical Activity: Not on file  Stress: Not on file  Social Connections: Not on file  Intimate Partner Violence: Not on file    Past Medical History, Surgical history, Social history, and Family history were reviewed and updated as appropriate.   Please see review of systems for further details on the patient's review from today.   Objective:   Physical Exam:  There were no vitals taken for this visit.  Physical Exam Constitutional:      General: He is not in acute distress. Musculoskeletal:        General: No deformity.  Neurological:     Mental Status: He is alert and oriented to person, place, and time.     Coordination: Coordination normal.  Psychiatric:        Attention and Perception: Attention and perception normal. He does not perceive auditory or visual hallucinations.        Mood and Affect: Mood normal. Mood is not anxious or depressed. Affect is not labile, blunt, angry or inappropriate.        Speech: Speech normal.        Behavior: Behavior normal.        Thought Content: Thought content normal. Thought content is not paranoid or delusional. Thought content does not include homicidal or suicidal ideation. Thought content does not include homicidal or  suicidal plan.        Cognition and Memory: Cognition and memory normal.        Judgment: Judgment normal.     Comments: Insight intact     Lab Review:     Component Value Date/Time   NA 142 02/13/2024 0757   K 4.1 02/13/2024 0757   CL 104 02/13/2024 0757   CO2 28 02/13/2024 0757   GLUCOSE 113 (H) 02/13/2024 0757   GLUCOSE 116 (H) 12/18/2019 1658   BUN 15 02/13/2024 0757   CREATININE 1.04 02/13/2024 0757   CREATININE 1.07 11/01/2014 0756   CALCIUM  9.4 02/13/2024 0757   PROT 6.0 07/09/2024 0751   ALBUMIN  4.3 07/09/2024 0751   AST 24 07/09/2024 0751   ALT 28 07/09/2024 0751   ALKPHOS 81 07/09/2024 0751   BILITOT 0.8 07/09/2024 0751   GFRNONAA 72 01/15/2021 0842   GFRAA 84 01/15/2021 0842       Component Value Date/Time   WBC 5.3 01/20/2022 0830   WBC 8.4 11/19/2019 2115   RBC 5.19 01/20/2022 0830   RBC 5.28 11/19/2019 2115   HGB 15.7 01/20/2022 0830   HCT 45.7 01/20/2022 0830   PLT 227 01/20/2022 0830   MCV 88 01/20/2022 0830   MCH 30.3 01/20/2022 0830   MCH 30.1 11/19/2019 2115   MCHC 34.4 01/20/2022 0830   MCHC 35.1 11/19/2019 2115   RDW 12.9 01/20/2022 0830   LYMPHSABS 1.7 01/20/2022 0830   EOSABS 0.3 01/20/2022 0830   BASOSABS 0.1 01/20/2022 0830    No results found for: POCLITH, LITHIUM   No results found for: PHENYTOIN, PHENOBARB, VALPROATE, CBMZ   .res Assessment: Plan:   Plan:  PDMP reviewed  1. Prozac  30mg  daily - discussed reducing dose to 20mg   NAC tabs  RTC 6 months  Patient advised to contact office with any questions, adverse effects, or acute worsening in signs and symptoms.  15 minutes spent dedicated to the care of this patient on the date of this encounter to include pre-visit review of records, ordering of medication, post visit documentation, and face-to-face time with the patient discussing GAD, MDD and OCD. Discussed continuing current medication regimen.  Discussed potential benefits, risk, and side effects of  benzodiazepines to include potential risk of tolerance and dependence, as well as possible drowsiness. Advised patient not to drive if experiencing drowsiness and to take lowest possible effective dose to minimize risk of dependence and tolerance.  There are no diagnoses linked to this encounter.   Please see After Visit Summary for patient specific instructions.  Future Appointments  Date Time Provider Department Center  10/25/2024  2:30 PM Quinten Allerton Nattalie, NP [facility] None  12/17/2024  9:15 AM Delford Maude BROCKS, MD [facility] [place] [facility]    No orders of the defined types were placed in this encounter.     -------------------------------

## 2024-10-26 ENCOUNTER — Telehealth: Payer: Self-pay | Admitting: *Deleted

## 2024-10-26 NOTE — Telephone Encounter (Signed)
 Copied from CRM #[id number]. Topic: Clinical - Request for Lab/Test Order >> Oct 26, 2024  8:38 AM Treva T wrote: Reason for CRM: Patient is scheduled for physical appt on 02/28/25, patient would like to have blood drawn prior to physical appt.  Per chart review no future lab orders seen.  Thank you

## 2024-10-28 ENCOUNTER — Other Ambulatory Visit: Payer: Self-pay | Admitting: Family Medicine

## 2024-10-28 DIAGNOSIS — E039 Hypothyroidism, unspecified: Secondary | ICD-10-CM

## 2024-10-28 DIAGNOSIS — Z0000 Encounter for general adult medical examination without abnormal findings: Secondary | ICD-10-CM

## 2024-10-28 DIAGNOSIS — E785 Hyperlipidemia, unspecified: Secondary | ICD-10-CM

## 2024-10-28 DIAGNOSIS — R7989 Other specified abnormal findings of blood chemistry: Secondary | ICD-10-CM

## 2024-10-28 DIAGNOSIS — Z79899 Other long term (current) drug therapy: Secondary | ICD-10-CM

## 2024-10-28 DIAGNOSIS — R7301 Impaired fasting glucose: Secondary | ICD-10-CM

## 2024-10-28 DIAGNOSIS — Z125 Encounter for screening for malignant neoplasm of prostate: Secondary | ICD-10-CM

## 2024-10-28 NOTE — Telephone Encounter (Signed)
 Lab work was ordered as requested I would recommend that he not do the lab work until the start of March-when doing the lab work be well-hydrated beforehand thank you

## 2024-12-17 ENCOUNTER — Ambulatory Visit: Admitting: Cardiovascular Disease

## 2025-01-05 ENCOUNTER — Other Ambulatory Visit: Payer: Self-pay | Admitting: Family Medicine

## 2025-01-05 DIAGNOSIS — E7800 Pure hypercholesterolemia, unspecified: Secondary | ICD-10-CM

## 2025-01-28 ENCOUNTER — Ambulatory Visit: Admitting: Cardiovascular Disease

## 2025-02-28 ENCOUNTER — Encounter: Admitting: Family Medicine
